# Patient Record
Sex: Female | Born: 1970 | Race: White | Hispanic: No | Marital: Married | Smoking: Current every day smoker
Health system: Southern US, Community
[De-identification: ages and names within clinical notes are randomized; demographics above are authoritative.]

## PROBLEM LIST (undated history)

## (undated) DIAGNOSIS — G44229 Chronic tension-type headache, not intractable: Secondary | ICD-10-CM

## (undated) DIAGNOSIS — F32A Depression, unspecified: Secondary | ICD-10-CM

## (undated) DIAGNOSIS — F329 Major depressive disorder, single episode, unspecified: Secondary | ICD-10-CM

## (undated) HISTORY — PX: CHOLECYSTECTOMY: SHX55

## (undated) HISTORY — PX: TUBAL LIGATION: SHX77

---

## 1997-08-31 ENCOUNTER — Emergency Department (HOSPITAL_COMMUNITY): Admission: EM | Admit: 1997-08-31 | Discharge: 1997-08-31 | Payer: Self-pay | Admitting: Emergency Medicine

## 2005-02-18 ENCOUNTER — Emergency Department: Payer: Self-pay | Admitting: Emergency Medicine

## 2006-08-23 ENCOUNTER — Ambulatory Visit: Payer: Self-pay | Admitting: Emergency Medicine

## 2008-05-23 ENCOUNTER — Emergency Department: Payer: Self-pay | Admitting: Internal Medicine

## 2009-04-14 ENCOUNTER — Emergency Department: Payer: Self-pay | Admitting: Emergency Medicine

## 2009-11-14 ENCOUNTER — Ambulatory Visit: Payer: Self-pay | Admitting: Internal Medicine

## 2011-06-04 ENCOUNTER — Ambulatory Visit: Payer: Self-pay

## 2011-07-23 ENCOUNTER — Ambulatory Visit: Payer: Self-pay

## 2011-08-12 ENCOUNTER — Emergency Department: Payer: Self-pay | Admitting: *Deleted

## 2013-03-04 ENCOUNTER — Ambulatory Visit: Payer: Self-pay

## 2014-06-15 ENCOUNTER — Ambulatory Visit: Payer: Self-pay | Admitting: Emergency Medicine

## 2014-09-10 ENCOUNTER — Ambulatory Visit
Admission: EM | Admit: 2014-09-10 | Discharge: 2014-09-10 | Disposition: A | Discharge status: Home / Self Care | Payer: Self-pay | Attending: Family Medicine | Admitting: Family Medicine

## 2014-09-10 ENCOUNTER — Encounter: Payer: Self-pay | Admitting: Gynecology

## 2014-09-10 DIAGNOSIS — G44209 Tension-type headache, unspecified, not intractable: Secondary | ICD-10-CM

## 2014-09-10 HISTORY — DX: Chronic tension-type headache, not intractable: G44.229

## 2014-09-10 MED ORDER — ONDANSETRON 8 MG PO TBDP: 8.0000 mg | ORAL_TABLET | Freq: Two times a day (BID) | ORAL | Status: DC

## 2014-09-10 MED ORDER — KETOROLAC TROMETHAMINE 10 MG PO TABS: 10.0000 mg | ORAL_TABLET | Freq: Three times a day (TID) | ORAL | Status: DC | PRN

## 2014-09-10 MED ORDER — KETOROLAC TROMETHAMINE 60 MG/2ML IM SOLN
60.0000 mg | Freq: Once | INTRAMUSCULAR | Status: AC
  Administered 2014-09-10: 60 mg via INTRAMUSCULAR

## 2014-09-10 MED ORDER — CYCLOBENZAPRINE HCL 10 MG PO TABS: 10.0000 mg | ORAL_TABLET | Freq: Every day | ORAL | Status: DC

## 2014-09-10 MED ORDER — ONDANSETRON 8 MG PO TBDP
8.0000 mg | ORAL_TABLET | Freq: Once | ORAL | Status: AC
  Administered 2014-09-10: 8 mg via ORAL

## 2014-09-10 NOTE — ED Provider Notes (Addendum)
CSN: [id number]     Arrival date & time 09/10/14  1550 History   First MD Initiated Contact with Patient 09/10/14 1651     Chief Complaint  Patient presents with  . Headache   (Consider location/radiation/quality/duration/timing/severity/associated sxs/prior Treatment) Patient is a 44 y.o. female presenting with headaches. The history is provided by the patient.  Headache Pain location:  Generalized Quality:  Dull (pressurelike band around head) Onset quality:  Sudden Duration:  2 weeks Timing:  Constant Progression:  Waxing and waning (better/less in the mornings ) Chronicity:  Chronic (h/o "mixed headaches") Relieved by: slightly with ibuprofen. Ineffective treatments:  NSAIDs and acetaminophen Associated symptoms: vomiting (3x today)   Associated symptoms: no blurred vision, no congestion, no fever, no hearing loss, no neck stiffness, no numbness, no paresthesias, no photophobia, no sore throat and no swollen glands     Past Medical History  Diagnosis Date  . Chronic tension headache     migrane   Past Surgical History  Procedure Laterality Date  . Cholecystectomy    . Tubal ligation     Family History  Problem Relation Age of Onset  . Hypertension Mother   . Diabetes Mother   . Hyperlipidemia Mother   . Heart failure Father    History  Substance Use Topics  . Smoking status: Current Every Day Smoker -- 0.50 packs/day  . Smokeless tobacco: Not on file  . Alcohol Use: No   OB History    No data available     Review of Systems  Constitutional: Negative for fever.  HENT: Negative for congestion, hearing loss and sore throat.   Eyes: Negative for blurred vision and photophobia.  Gastrointestinal: Positive for vomiting (3x today).  Musculoskeletal: Negative for neck stiffness.  Neurological: Positive for headaches. Negative for numbness and paresthesias.    Allergies  Aspirin  Home Medications   Prior to Admission medications   Medication Sig Start Date  End Date Taking? Authorizing Provider  ibuprofen (ADVIL,MOTRIN) 200 MG tablet Take 200 mg by mouth every 6 (six) hours as needed.   Yes Historical Provider, MD  cyclobenzaprine (FLEXERIL) 10 MG tablet Take 1 tablet (10 mg total) by mouth at bedtime. 09/10/14   Payton Mccallumrlando Jabriel Vanduyne, MD  ketorolac (TORADOL) 10 MG tablet Take 1 tablet (10 mg total) by mouth every 8 (eight) hours as needed. 09/10/14   Payton Mccallumrlando Sarena Jezek, MD  ondansetron (ZOFRAN ODT) 8 MG disintegrating tablet Take 1 tablet (8 mg total) by mouth 2 (two) times daily. 09/10/14   Payton Mccallumrlando Jhaniya Briski, MD   BP 110/80 mmHg  Pulse 74  Temp(Src) 98.1 F (36.7 C) (Oral)  Ht 5\' 1"  (1.549 m)  Wt 145 lb (65.772 kg)  BMI 27.41 kg/m2  SpO2 100%  LMP 09/02/2014 Physical Exam  Constitutional: She is oriented to person, place, and time. She appears well-developed and well-nourished. No distress.  HENT:  Head: Normocephalic.  Right Ear: Tympanic membrane, external ear and ear canal normal.  Left Ear: Tympanic membrane, external ear and ear canal normal.  Nose: Nose normal.  Mouth/Throat: Oropharynx is clear and moist and mucous membranes are normal.  Eyes: Conjunctivae and EOM are normal. Pupils are equal, round, and reactive to light. Right eye exhibits no discharge. Left eye exhibits no discharge. No scleral icterus.  Neck: Normal range of motion. Neck supple. No JVD present. No tracheal deviation present. No thyromegaly present.  Pulmonary/Chest: Effort normal. No stridor. No respiratory distress.  Abdominal: Soft. Bowel sounds are normal. She exhibits no distension and  no mass. There is no tenderness. There is no rebound and no guarding.  Musculoskeletal: She exhibits tenderness. She exhibits no edema.  Lymphadenopathy:    She has no cervical adenopathy.  Neurological: She is alert and oriented to person, place, and time. She has normal reflexes. She displays normal reflexes. No cranial nerve deficit. She exhibits normal muscle tone. Coordination normal.   Skin: Skin is warm and dry. No rash noted. She is not diaphoretic.  Psychiatric: She has a normal mood and affect. Her behavior is normal. Judgment and thought content normal.  Vitals reviewed.   ED Course  Procedures (including critical care time) Labs Review Labs Reviewed - No data to display  Imaging Review No results found.   MDM   1. Tension headache    Discharge Medication List as of 09/10/2014  5:59 PM    START taking these medications   Details  cyclobenzaprine (FLEXERIL) 10 MG tablet Take 1 tablet (10 mg total) by mouth at bedtime., Starting 09/10/2014, Until Discontinued, Normal    ketorolac (TORADOL) 10 MG tablet Take 1 tablet (10 mg total) by mouth every 8 (eight) hours as needed., Starting 09/10/2014, Until Discontinued, Normal    ondansetron (ZOFRAN ODT) 8 MG disintegrating tablet Take 1 tablet (8 mg total) by mouth 2 (two) times daily., Starting 09/10/2014, Until Discontinued, Normal          Payton Mccallum, MD 09/12/14 1910   17:21 Medication Given AH  ketorolac (TORADOL) injection 60 mg - Dose: 60 mg ; Route: Intramuscular ; Site: Other ; Scheduled Time: 1715       Payton Mccallum, MD 09/15/14 1455

## 2014-09-10 NOTE — ED Notes (Signed)
Patient c/o diagnose with tension headache. Pt. Stated had headache x 3 weeks. Pt. Stated [facility] in [place] and unable to get an apt. Until 6/8. Pt. Stated headache cause her to feel nauseated and sometime vomit. Pt. Stated going under a lot of stress.

## 2014-12-02 ENCOUNTER — Encounter: Payer: Self-pay | Admitting: Emergency Medicine

## 2014-12-02 ENCOUNTER — Ambulatory Visit: Payer: No Typology Code available for payment source

## 2014-12-02 ENCOUNTER — Ambulatory Visit
Admission: EM | Admit: 2014-12-02 | Discharge: 2014-12-02 | Disposition: A | Discharge status: Home / Self Care | Payer: No Typology Code available for payment source | Attending: Emergency Medicine | Admitting: Emergency Medicine

## 2014-12-02 DIAGNOSIS — M542 Cervicalgia: Secondary | ICD-10-CM | POA: Diagnosis present

## 2014-12-02 DIAGNOSIS — M62838 Other muscle spasm: Secondary | ICD-10-CM | POA: Diagnosis not present

## 2014-12-02 MED ORDER — METHOCARBAMOL 750 MG PO TABS: 750.0000 mg | ORAL_TABLET | Freq: Three times a day (TID) | ORAL | Status: DC | PRN

## 2014-12-02 MED ORDER — HYDROCODONE-ACETAMINOPHEN 5-325 MG PO TABS: 1.0000 | ORAL_TABLET | Freq: Four times a day (QID) | ORAL | Status: DC | PRN

## 2014-12-02 MED ORDER — KETOROLAC TROMETHAMINE 60 MG/2ML IM SOLN
60.0000 mg | Freq: Once | INTRAMUSCULAR | Status: AC
  Administered 2014-12-02: 60 mg via INTRAMUSCULAR

## 2014-12-02 NOTE — Discharge Instructions (Signed)
Prefer Ibuprofen 800 mg three times a day with meals...supplement with Vicodin if needed.  Do not drive ! Robaxin 750 mg 1 tab at bedtime , may repeat during day if needed- Do not drive !   Back Pain, Adult Back pain is very common. The pain often gets better over time. The cause of back pain is usually not dangerous. Most people can learn to manage their back pain on their own.  HOME CARE   Stay active. Start with short walks on flat ground if you can. Try to walk farther each day.  Do not sit, drive, or stand in one place for more than 30 minutes. Do not stay in bed.  Do not avoid exercise or work. Activity can help your back heal faster.  Be careful when you bend or lift an object. Bend at your knees, keep the object close to you, and do not twist.  Sleep on a firm mattress. Lie on your side, and bend your knees. If you lie on your back, put a pillow under your knees.  Only take medicines as told by your doctor.  Put ice on the injured area.  Put ice in a plastic bag.  Place a towel between your skin and the bag.  Leave the ice on for 15-20 minutes, 03-04 times a day for the first 2 to 3 days. After that, you can switch between ice and heat packs.  Ask your doctor about back exercises or massage.  Avoid feeling anxious or stressed. Find good ways to deal with stress, such as exercise. GET HELP RIGHT AWAY IF:   Your pain does not go away with rest or medicine.  Your pain does not go away in 1 week.  You have new problems.  You do not feel well.  The pain spreads into your legs.  You cannot control when you poop (bowel movement) or pee (urinate).  Your arms or legs feel weak or lose feeling (numbness).  You feel sick to your stomach (nauseous) or throw up (vomit).  You have belly (abdominal) pain.  You feel like you may pass out (faint). MAKE SURE YOU:   Understand these instructions.  Will watch your condition.  Will get help right away if you are not  doing well or get worse. Document Released: 09/19/2007 Document Revised: 06/25/2011 Document Reviewed: 08/04/2013 [facility] Patient Information 2015 [place], [place]. This information is not intended to replace advice given to you by your health care provider. Make sure you discuss any questions you have with your health care provider.  Heat Therapy Heat therapy can help ease sore, stiff, injured, and tight muscles and joints. Heat relaxes your muscles, which may help ease your pain.  RISKS AND COMPLICATIONS If you have any of the following conditions, do not use heat therapy unless your health care provider has approved:  Poor circulation.  Healing wounds or scarred skin in the area being treated.  Diabetes, heart disease, or high blood pressure.  Not being able to feel (numbness) the area being treated.  Unusual swelling of the area being treated.  Active infections.  Blood clots.  Cancer.  Inability to communicate pain. This may include young children and people who have problems with their brain function (dementia).  Pregnancy. Heat therapy should only be used on old, pre-existing, or long-lasting (chronic) injuries. Do not use heat therapy on new injuries unless directed by your health care provider. HOW TO USE HEAT THERAPY There are several different kinds of heat therapy, including:  Moist heat pack.  Warm water bath.  Hot water bottle.  Electric heating pad.  Heated gel pack.  Heated wrap.  Electric heating pad. Use the heat therapy method suggested by your health care provider. Follow your health care provider's instructions on when and how to use heat therapy. GENERAL HEAT THERAPY RECOMMENDATIONS  Do not sleep while using heat therapy. Only use heat therapy while you are awake.  Your skin may turn pink while using heat therapy. Do not use heat therapy if your skin turns red.  Do not use heat therapy if you have new pain.  High heat or long exposure to  heat can cause burns. Be careful when using heat therapy to avoid burning your skin.  Do not use heat therapy on areas of your skin that are already irritated, such as with a rash or sunburn. SEEK MEDICAL CARE IF:  You have blisters, redness, swelling, or numbness.  You have new pain.  Your pain is worse. MAKE SURE YOU:  Understand these instructions.  Will watch your condition.  Will get help right away if you are not doing well or get worse. Document Released: 06/25/2011 Document Revised: 08/17/2013 Document Reviewed: 05/26/2013 [facility] Patient Information 2015 [place], [place]. This information is not intended to replace advice given to you by your health care provider. Make sure you discuss any questions you have with your health care provider.
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
## 2014-12-03 ENCOUNTER — Encounter: Payer: Self-pay | Admitting: Physician Assistant

## 2016-09-25 ENCOUNTER — Encounter: Payer: Self-pay | Admitting: Emergency Medicine

## 2016-09-25 ENCOUNTER — Ambulatory Visit
Admission: EM | Admit: 2016-09-25 | Discharge: 2016-09-25 | Disposition: A | Discharge status: Home / Self Care | Payer: Self-pay | Attending: Family Medicine | Admitting: Family Medicine

## 2016-09-25 DIAGNOSIS — J069 Acute upper respiratory infection, unspecified: Secondary | ICD-10-CM

## 2016-09-25 MED ORDER — ALBUTEROL SULFATE HFA 108 (90 BASE) MCG/ACT IN AERS: 1.0000 | INHALATION_SPRAY | Freq: Four times a day (QID) | RESPIRATORY_TRACT | 0 refills | Status: DC | PRN

## 2016-09-25 MED ORDER — AEROCHAMBER PLUS FLO-VU MEDIUM MISC: 1.0000 | Freq: Once | Status: DC

## 2016-09-25 MED ORDER — HYDROCOD POLST-CPM POLST ER 10-8 MG/5ML PO SUER: 5.0000 mL | Freq: Two times a day (BID) | ORAL | 0 refills | Status: DC

## 2016-09-25 MED ORDER — PREDNISONE 20 MG PO TABS: ORAL_TABLET | ORAL | 0 refills | Status: DC

## 2016-09-25 MED ORDER — BENZONATATE 200 MG PO CAPS: ORAL_CAPSULE | ORAL | 0 refills | Status: DC

## 2016-09-25 NOTE — ED Triage Notes (Signed)
Patient c/o cough, chest congestion and fever that started last Wed.

## 2016-09-25 NOTE — ED Provider Notes (Signed)
CSN: [id number]     Arrival date & time 09/25/16  1553 History   First MD Initiated Contact with Patient 09/25/16 1720     Chief Complaint  Patient presents with  . Cough   (Consider location/radiation/quality/duration/timing/severity/associated sxs/prior Treatment) HPI  This a 46 year old female pack-a-day smoker who presents with cough and chest congestion . This started approximately 6 days ago. She denies that the nighttime cough is the worst. She has not slept well in the last couple of nights. Fever has been consistent until the last couple of days but she's been taking ibuprofen for the fever and has been much less. Initially she had nasal congestion and rhinorrhea this has since stopped. She has had chest pain over the left lower ribs when she coughs.     Past Medical History:  Diagnosis Date  . Chronic tension headache    migrane   Past Surgical History:  Procedure Laterality Date  . CHOLECYSTECTOMY    . TUBAL LIGATION     Family History  Problem Relation Age of Onset  . Hypertension Mother   . Diabetes Mother   . Hyperlipidemia Mother   . Heart failure Father    Social History  Substance Use Topics  . Smoking status: Current Every Day Smoker    Packs/day: 0.50  . Smokeless tobacco: Never Used  . Alcohol use No   OB History    No data available     Review of Systems  Constitutional: Positive for activity change, chills and fever.  HENT: Positive for postnasal drip, rhinorrhea and sore throat.   Respiratory: Positive for cough and shortness of breath.   All other systems reviewed and are negative.   Allergies  Aspirin  Home Medications   Prior to Admission medications   Medication Sig Start Date End Date Taking? Authorizing Provider  albuterol (PROVENTIL HFA;VENTOLIN HFA) 108 (90 Base) MCG/ACT inhaler Inhale 1-2 puffs into the lungs every 6 (six) hours as needed for wheezing or shortness of breath. 09/25/16   Lutricia Feil, PA-C  benzonatate  (TESSALON) 200 MG capsule Take one cap TID PRN cough 09/25/16   Lutricia Feil, PA-C  chlorpheniramine-HYDROcodone [facility]) 10-8 MG/5ML SUER Take 5 mLs by mouth 2 (two) times daily. 09/25/16   Lutricia Feil, PA-C  ibuprofen (ADVIL,MOTRIN) 200 MG tablet Take 200 mg by mouth every 6 (six) hours as needed.    [provider]  predniSONE (DELTASONE) 20 MG tablet Take 2 tablets (40 mg) daily by mouth 09/25/16   Lutricia Feil, PA-C   Meds Ordered and Administered this Visit   Medications  AEROCHAMBER PLUS FLO-VU MEDIUM MISC 1 each (not administered)    BP (!) 151/76 (BP Location: Left Arm)   Pulse 78   Temp 98.5 F (36.9 C) (Oral)   Resp 16   Ht 5\' 1"  (1.549 m)   Wt 135 lb (61.2 kg)   LMP 09/18/2016 (Exact Date)   SpO2 100%   BMI 25.51 kg/m  No data found.   Physical Exam  Constitutional: She is oriented to person, place, and time. She appears well-developed and well-nourished. No distress.  HENT:  Head: Normocephalic.  Eyes: Pupils are equal, round, and reactive to light.  Neck: Normal range of motion.  Pulmonary/Chest: Effort normal and breath sounds normal. No respiratory distress. She has no wheezes. She has no rales.  Musculoskeletal: Normal range of motion.  Neurological: She is alert and oriented to person, place, and time.  Skin: Skin is  warm and dry. She is not diaphoretic.  Psychiatric: She has a normal mood and affect. Her behavior is normal. Judgment and thought content normal.  Nursing note and vitals reviewed.   Urgent Care Course     Procedures (including critical care time)  Labs Review Labs Reviewed - No data to display  Imaging Review No results found.   Visual Acuity Review  Right Eye Distance:   Left Eye Distance:   Bilateral Distance:    Right Eye Near:   Left Eye Near:    Bilateral Near:         MDM   1. Acute upper respiratory infection    Discharge Medication List as of 09/25/2016  5:33 PM     START taking these medications   Details  albuterol (PROVENTIL HFA;VENTOLIN HFA) 108 (90 Base) MCG/ACT inhaler Inhale 1-2 puffs into the lungs every 6 (six) hours as needed for wheezing or shortness of breath., Starting Tue 09/25/2016, Normal    benzonatate (TESSALON) 200 MG capsule Take one cap TID PRN cough, Normal    chlorpheniramine-HYDROcodone (TUSSIONEX PENNKINETIC ER) 10-8 MG/5ML SUER Take 5 mLs by mouth 2 (two) times daily., Starting Tue 09/25/2016, Print    predniSONE (DELTASONE) 20 MG tablet Take 2 tablets (40 mg) daily by mouth, Normal      Plan: 1. Test/x-ray results and diagnosis reviewed with patient 2. rx as per orders; risks, benefits, potential side effects reviewed with patient 3. Recommend supportive treatment with Cessation of smoking. Uses albuterol as necessary for any shortness of breath. Cautioned her regarding the use of the hydrocodone for activities requiring concentration and judgment. She should not drive while using the medication. 4. F/u prn if symptoms worsen or don't improve     Lutricia FeilRoemer, William P, PA-C 09/25/16 1743

## 2016-10-29 ENCOUNTER — Encounter: Payer: Self-pay | Admitting: Emergency Medicine

## 2016-10-29 ENCOUNTER — Emergency Department
Admission: EM | Admit: 2016-10-29 | Discharge: 2016-10-29 | Disposition: A | Discharge status: Home / Self Care | Payer: Self-pay | Attending: Emergency Medicine | Admitting: Emergency Medicine

## 2016-10-29 DIAGNOSIS — M25559 Pain in unspecified hip: Secondary | ICD-10-CM | POA: Insufficient documentation

## 2016-10-29 DIAGNOSIS — M533 Sacrococcygeal disorders, not elsewhere classified: Secondary | ICD-10-CM

## 2016-10-29 DIAGNOSIS — G8929 Other chronic pain: Secondary | ICD-10-CM

## 2016-10-29 DIAGNOSIS — F17210 Nicotine dependence, cigarettes, uncomplicated: Secondary | ICD-10-CM | POA: Insufficient documentation

## 2016-10-29 MED ORDER — DEXAMETHASONE SODIUM PHOSPHATE 10 MG/ML IJ SOLN
10.0000 mg | Freq: Once | INTRAMUSCULAR | Status: AC
  Administered 2016-10-29: 10 mg via INTRAMUSCULAR
  Filled 2016-10-29: qty 1

## 2016-10-29 MED ORDER — LIDOCAINE 5 % EX PTCH: 1.0000 | MEDICATED_PATCH | Freq: Two times a day (BID) | CUTANEOUS | 0 refills | Status: DC

## 2016-10-29 MED ORDER — DIAZEPAM 5 MG PO TABS: 5.0000 mg | ORAL_TABLET | Freq: Every evening | ORAL | 0 refills | Status: DC | PRN

## 2016-10-29 MED ORDER — PREDNISONE 10 MG PO TABS: ORAL_TABLET | ORAL | 0 refills | Status: DC

## 2016-10-29 NOTE — ED Notes (Signed)
See triage note   States she developed lower back pain for about 1 year   States pain has changed in intensity  Ambulates well to treatment area

## 2016-10-29 NOTE — ED Triage Notes (Signed)
Pt with low back pain for over a year.

## 2016-10-29 NOTE — ED Provider Notes (Signed)
[facility] Emergency Department Provider Note  ____________________________________________   None    (approximate)  I have reviewed the triage vital signs and the nursing notes.   HISTORY  Chief Complaint Back Pain    HPI Anita [name] is a 46 y.o. female is here with complaint of low back pain for almost one year. Patient denies any injury. She states that she has continued to work in with the pain. She has taken over-the-counter medication including ibuprofen 4-6 tablets 3 times a day without any relief of her pain. She denies any urinary symptoms. Occasionally she does get pain in her left leg. Patient is continue to ambulate and work every day despite her pain. Today she rates her pain as a 6 out of 10. She has not seen a medical provider for this prior to today. Patient states she is unable to sleep well at night because of pain.   Past Medical History:  Diagnosis Date  . Chronic tension headache    migrane    There are no active problems to display for this patient.   Past Surgical History:  Procedure Laterality Date  . CHOLECYSTECTOMY    . TUBAL LIGATION      Prior to Admission medications   Medication Sig Start Date End Date Taking? Authorizing Provider  albuterol (PROVENTIL HFA;VENTOLIN HFA) 108 (90 Base) MCG/ACT inhaler Inhale 1-2 puffs into the lungs every 6 (six) hours as needed for wheezing or shortness of breath. 09/25/16   Lutricia Feil, PA-C  diazepam (VALIUM) 5 MG tablet Take 1 tablet (5 mg total) by mouth at bedtime as needed for muscle spasms. 10/29/16   Tommi Rumps, PA-C  ibuprofen (ADVIL,MOTRIN) 200 MG tablet Take 200 mg by mouth every 6 (six) hours as needed.    [provider]  lidocaine (LIDODERM) 5 % Place 1 patch onto the skin every 12 (twelve) hours. Remove & Discard patch within 12 hours or as directed by MD 10/29/16 10/29/17  Tommi Rumps, PA-C  predniSONE (DELTASONE) 10 MG tablet Take 6 tablets   today, on day 2 take 5 tablets, day 3 take 4 tablets, day 4 take 3 tablets, day 5 take  2 tablets and 1 tablet the last day 10/29/16   Tommi Rumps, PA-C    Allergies Aspirin  Family History  Problem Relation Age of Onset  . Hypertension Mother   . Diabetes Mother   . Hyperlipidemia Mother   . Heart failure Father     Social History Social History  Substance Use Topics  . Smoking status: Current Every Day Smoker    Packs/day: 0.50  . Smokeless tobacco: Never Used  . Alcohol use No    Review of Systems Constitutional: No fever/chills Cardiovascular: Denies chest pain. Respiratory: Denies shortness of breath. Gastrointestinal: No abdominal pain.  No nausea, no vomiting.   Genitourinary: Negative for dysuria. Musculoskeletal: Positive for back pain. Skin: Negative for rash. Neurological: Negative for headaches, focal weakness or numbness.   ____________________________________________   PHYSICAL EXAM:  VITAL SIGNS: ED Triage Vitals [10/29/16 1547]  Enc Vitals Group     BP      Pulse      Resp      Temp      Temp src      SpO2      Weight 135 lb (61.2 kg)     Height      Head Circumference      Peak Flow  Pain Score 6     Pain Loc      Pain Edu?      Excl. in GC?     Constitutional: Alert and oriented. Well appearing and in no acute distress. Eyes: Conjunctivae are normal. PERRL. EOMI. Head: Atraumatic. Neck: No stridor.   Cardiovascular: Normal rate, regular rhythm. Grossly normal heart sounds.  Good peripheral circulation. Respiratory: Normal respiratory effort.  No retractions. Lungs CTAB. Gastrointestinal: Soft and nontender. No distention.  No CVA tenderness. Musculoskeletal: On examination of the back there is no gross deformity and no tenderness on palpation of the spinous processes. There is moderate tenderness on palpation of the left SI joint area and soft tissues surrounding that. Straight leg raises are negative but uncomfortable with  the left leg. Patient is ambulatory without assistance. Good muscle strength is present bilaterally. Neurologic:  Normal speech and language. No gross focal neurologic deficits are appreciated. Reflexes are 2+ bilaterally. No gait instability. Skin:  Skin is warm, dry and intact. No ecchymosis, erythema or abrasions are noted. Psychiatric: Mood and affect are normal. Speech and behavior are normal.  ____________________________________________   LABS (all labs ordered are listed, but only abnormal results are displayed)  Labs Reviewed - No data to display   PROCEDURES  Procedure(s) performed: None  Procedures  Critical Care performed: No  ____________________________________________   INITIAL IMPRESSION / ASSESSMENT AND PLAN / ED COURSE  Pertinent labs & imaging results that were available during my care of the patient were reviewed by me and considered in my medical decision making (see chart for details).  Patient was encouraged [facility] any continued problems. She is continue using ice and heat to the area. She is started on prednisone for the next 6 days as tapering dose beginning at 60 mg. Diazepam 5 mg only at bedtime as needed for muscle spasms. She was warned that this medication could cause drowsiness increase her risk for injury. She is also given a prescription for Lidoderm patch 1 every 12 hours over the area that is painful.   ___________________________________________   FINAL CLINICAL IMPRESSION(S) / ED DIAGNOSES  Final diagnoses:  Chronic left SI joint pain      NEW MEDICATIONS STARTED DURING THIS VISIT:  Discharge Medication List as of 10/29/2016  5:07 PM    START taking these medications   Details  diazepam (VALIUM) 5 MG tablet Take 1 tablet (5 mg total) by mouth at bedtime as needed for muscle spasms., Starting Mon 10/29/2016, Print    lidocaine (LIDODERM) 5 % Place 1 patch onto the skin every 12  (twelve) hours. Remove & Discard patch within 12 hours or as directed by MD, Starting Mon 10/29/2016, Until Tue 10/29/2017, Print         Note:  This document was prepared using Dragon voice recognition software and may include unintentional dictation errors.    Tommi RumpsSummers, Rhonda L, PA-C 10/29/16 1827    Minna AntisPaduchowski, Kevin, MD 10/30/16 Kristopher [id number]

## 2016-10-29 NOTE — Discharge Instructions (Signed)
[facility] if any continued problems. Continue using ice or heat to the area. Begin taking prednisone as directed for the next 6 days. Valium 5 mg only at bedtime as needed for spasms. Did not take this medication during the day as it could cause drowsiness increase her risk for injury. Lidoderm patch one every 12 hours directly on the area that is painful.

## 2017-08-29 ENCOUNTER — Other Ambulatory Visit: Payer: Self-pay

## 2017-08-29 ENCOUNTER — Ambulatory Visit
Admission: EM | Admit: 2017-08-29 | Discharge: 2017-08-29 | Disposition: A | Discharge status: Home / Self Care | Payer: Self-pay | Attending: Family Medicine | Admitting: Family Medicine

## 2017-08-29 ENCOUNTER — Encounter: Payer: Self-pay | Admitting: Gynecology

## 2017-08-29 DIAGNOSIS — J340 Abscess, furuncle and carbuncle of nose: Secondary | ICD-10-CM

## 2017-08-29 DIAGNOSIS — J3489 Other specified disorders of nose and nasal sinuses: Secondary | ICD-10-CM

## 2017-08-29 HISTORY — DX: Depression, unspecified: F32.A

## 2017-08-29 HISTORY — DX: Major depressive disorder, single episode, unspecified: F32.9

## 2017-08-29 MED ORDER — KETOROLAC TROMETHAMINE 60 MG/2ML IM SOLN
60.0000 mg | Freq: Once | INTRAMUSCULAR | Status: AC
  Administered 2017-08-29: 60 mg via INTRAMUSCULAR

## 2017-08-29 MED ORDER — MUPIROCIN 2 % EX OINT: TOPICAL_OINTMENT | CUTANEOUS | 0 refills | Status: DC

## 2017-08-29 MED ORDER — DOXYCYCLINE HYCLATE 100 MG PO CAPS: 100.0000 mg | ORAL_CAPSULE | Freq: Two times a day (BID) | ORAL | 0 refills | Status: DC

## 2017-08-29 NOTE — Discharge Instructions (Addendum)
Take medication as prescribed. Rest. Drink plenty of fluids.  ° °Follow up with your primary care physician this week as needed. Return to Urgent care for new or worsening concerns.  ° °

## 2017-08-29 NOTE — ED Triage Notes (Signed)
Patient c/o nose pain x 4 days.

## 2017-08-29 NOTE — ED Provider Notes (Signed)
[facility] ____________________________________________  Time seen: Approximately 6:11 PM  I have reviewed the triage vital signs and the nursing notes.   HISTORY  Chief Complaint Nose pain  HPI Anita [name] is a 47 y.o. female presenting for evaluation of right-sided nasal pain present for the last 3 days.  Denies any trauma, injury or known trigger.  Patient reports she is just getting back from a recent beach trip but denies any atypical changes there as well.  Denies insect bite.  States that on Monday she felt a small area against her right nostril that like she was developing a typical temple, but reports the tenderness quickly increased.  Reports over the last 2 days she has had increased tenderness as well as some mild swelling and redness as well as tenderness on the inside of her right nostril to.  Denies any drainage.  Reports able to continue to breathe through each nostril.  Denies history of same in the past.  No history of MRSA or previous skin infections.  Reports otherwise feels well.  Has taken some over-the-counter Tylenol and ibuprofen without much change.  No recent cough, congestion, sinus pain.  Reports otherwise feels well. Denies recent sickness. Denies recent antibiotic use.  Reports up-to-date on tetanus imitation.  [place]:PCP   Past Medical History:  Diagnosis Date  . Chronic tension headache    migrane  . Depression     There are no active problems to display for this patient.   Past Surgical History:  Procedure Laterality Date  . CHOLECYSTECTOMY    . TUBAL LIGATION       No current facility-administered medications for this encounter.   Current Outpatient Medications:  .  ALPRAZolam (XANAX) 0.25 MG tablet, Take by mouth., Disp: , Rfl:  .  ibuprofen (ADVIL,MOTRIN) 200 MG tablet, Take 200 mg by mouth every 6 (six) hours as needed., Disp: , Rfl:  .  venlafaxine XR (EFFEXOR-XR) 75 MG 24 hr capsule, Take by mouth.,  Disp: , Rfl:  .  doxycycline (VIBRAMYCIN) 100 MG capsule, Take 1 capsule (100 mg total) by mouth 2 (two) times daily., Disp: 20 capsule, Rfl: 0 .  mupirocin ointment (BACTROBAN) 2 %, Apply two times a day for 7 days., Disp: 22 g, Rfl: 0  Allergies Aspirin  Family History  Problem Relation Age of Onset  . Hypertension Mother   . Diabetes Mother   . Hyperlipidemia Mother   . Heart failure Father     Social History Social History   Tobacco Use  . Smoking status: Current Every Day Smoker    Packs/day: 0.50  . Smokeless tobacco: Never Used  Substance Use Topics  . Alcohol use: No  . Drug use: No    Review of Systems Constitutional: No fever Eyes: No visual changes. ENT: No sore throat. Cardiovascular: Denies chest pain. Respiratory: Denies shortness of breath. Gastrointestinal: No abdominal pain.   Musculoskeletal: Negative for back pain. Skin: As above.    ____________________________________________   PHYSICAL EXAM:  VITAL SIGNS: ED Triage Vitals  Enc Vitals Group     BP 08/29/17 1724 (!) 142/87     Pulse Rate 08/29/17 1724 87     Resp 08/29/17 1724 16     Temp 08/29/17 1724 98.6 F (37 C)     Temp Source 08/29/17 1724 Oral     SpO2 08/29/17 1724 100 %     Weight 08/29/17 1725 120 lb (54.4 kg)     Height 08/29/17 1725   (1.549 m)     Head Circumference --      Peak Flow --      Pain Score 08/29/17 1725 5     Pain Loc --      Pain Edu? --      Excl. in GC? --     Constitutional: Alert and oriented. Well appearing and in no acute distress. Eyes: Conjunctivae are normal.  Head: Atraumatic. No sinus tenderness to palpation. No swelling. No erythema.  Ears: no erythema, normal TMs bilaterally.   Nose:No nasal congestion   Mouth/Throat: Mucous membranes are moist. No pharyngeal erythema. No tonsillar swelling or exudate.  Neck: No stridor.  No cervical spine tenderness to palpation. Hematological/Lymphatic/Immunilogical: No cervical  lymphadenopathy. Cardiovascular: Normal rate, regular rhythm. Grossly normal heart sounds.  Good peripheral circulation. Respiratory: Normal respiratory effort.  No retractions. No wheezes, rales or rhonchi. Good air movement.  Musculoskeletal: Ambulatory with steady gait. No cervical, thoracic or lumbar tenderness to palpation. Neurologic:  Normal speech and language. No gait instability. Skin:  Skin appears warm, dry. Except: right lateral nasal fold crease mild induration and erythema with tenderness, internal nare with lateral nasal fold mild thickened skin, no fluctuance, no drainage, no further surrounding erythema or tenderness, bilateral nares patent. Psychiatric: Mood and affect are normal. Speech and behavior are normal.  ___________________________________________   LABS (all labs ordered are listed, but only abnormal results are displayed)  Labs Reviewed - No data to display ____________________________________________   PROCEDURES Procedures    INITIAL IMPRESSION / ASSESSMENT AND PLAN / ED COURSE  Pertinent labs & imaging results that were available during my care of the patient were reviewed by me and considered in my medical decision making (see chart for details).  Well-appearing patient.  No acute distress.  Suspect right facial nasal cellulitis.  Will treat patient with oral doxycycline and topical Bactroban.  Toradol IM given in urgent care.  Encouraged keeping clean, closely monitoring and supportive care.Discussed indication, risks and benefits of medications with patient.  Discussed follow up with Primary care physician this week. Discussed follow up and return parameters including no resolution or any worsening concerns. Patient verbalized understanding and agreed to plan.   ____________________________________________   FINAL CLINICAL IMPRESSION(S) / ED DIAGNOSES  Final diagnoses:  Cellulitis of nose     ED Discharge Orders        Ordered     doxycycline (VIBRAMYCIN) 100 MG capsule  2 times daily     08/29/17 1743    mupirocin ointment (BACTROBAN) 2 %     08/29/17 1743       Note: This dictation was prepared with Dragon dictation along with smaller phrase technology. Any transcriptional errors that result from this process are unintentional.         Renford Dills, NP 08/29/17 1851

## 2018-02-06 ENCOUNTER — Encounter: Payer: Self-pay | Admitting: Emergency Medicine

## 2018-02-06 ENCOUNTER — Emergency Department
Admission: EM | Admit: 2018-02-06 | Discharge: 2018-02-06 | Discharge status: Against Medical Advice | Payer: Self-pay | Attending: Student in an Organized Health Care Education/Training Program | Admitting: Student in an Organized Health Care Education/Training Program

## 2018-02-06 ENCOUNTER — Other Ambulatory Visit: Payer: Self-pay

## 2018-02-06 DIAGNOSIS — Z79899 Other long term (current) drug therapy: Secondary | ICD-10-CM | POA: Insufficient documentation

## 2018-02-06 DIAGNOSIS — M5412 Radiculopathy, cervical region: Secondary | ICD-10-CM | POA: Insufficient documentation

## 2018-02-06 DIAGNOSIS — F17210 Nicotine dependence, cigarettes, uncomplicated: Secondary | ICD-10-CM | POA: Insufficient documentation

## 2018-02-06 MED ORDER — IBUPROFEN 600 MG PO TABS
600.0000 mg | ORAL_TABLET | Freq: Once | ORAL | Status: AC
  Administered 2018-02-06: 600 mg via ORAL
  Filled 2018-02-06: qty 1

## 2018-02-06 MED ORDER — LIDOCAINE 5 % EX PTCH
1.0000 | MEDICATED_PATCH | CUTANEOUS | Status: DC
  Administered 2018-02-06: 1 via TRANSDERMAL
  Filled 2018-02-06: qty 1

## 2018-02-06 NOTE — ED Notes (Signed)
See triage note  Presents with pain which radiates from neck into left arm for the past 1 month  Also has been having a tension like h/a   Describes pain to shoulder and arm as numbness

## 2018-02-06 NOTE — ED Provider Notes (Signed)
[facility] Emergency Department Provider Note  ____________________________________________  Time seen: Approximately 7:19 PM  I have reviewed the triage vital signs and the nursing notes.   HISTORY  Chief Complaint Numbness and Neck Pain    HPI Anita [name] is a 47 y.o. female presents emergency department for evaluation of neck pain and left arm numbness and tingling for 1 month.  Patient states that symptoms worsened over the last week.  Pain worsens when she turns her head to the left.  Pain is giving her a headache.  She sees [facility] but states that they would not see her because she owes them money.  No fever.  No IV drug use.  No trauma.  She has been taking ibuprofen for pain.  No vision changes, shortness of breath, chest pain, abdominal pain.   Past Medical History:  Diagnosis Date  . Chronic tension headache    migrane  . Depression     There are no active problems to display for this patient.   Past Surgical History:  Procedure Laterality Date  . CHOLECYSTECTOMY    . TUBAL LIGATION      Prior to Admission medications   Medication Sig Start Date End Date Taking? Authorizing Provider  ALPRAZolam Prudy Feeler) 0.25 MG tablet Take by mouth. 08/13/17   [provider]  doxycycline (VIBRAMYCIN) 100 MG capsule Take 1 capsule (100 mg total) by mouth 2 (two) times daily. 08/29/17   Renford Dills, NP  ibuprofen (ADVIL,MOTRIN) 200 MG tablet Take 200 mg by mouth every 6 (six) hours as needed.    [provider]  mupirocin ointment (BACTROBAN) 2 % Apply two times a day for 7 days. 08/29/17   Renford Dills, NP  venlafaxine XR (EFFEXOR-XR) 75 MG 24 hr capsule Take by mouth. 08/08/17 09/07/17  [provider]    Allergies Aspirin  Family History  Problem Relation Age of Onset  . Hypertension Mother   . Diabetes Mother   . Hyperlipidemia Mother   . Heart failure Father     Social History Social History    Tobacco Use  . Smoking status: Current Every Day Smoker    Packs/day: 0.50  . Smokeless tobacco: Never Used  Substance Use Topics  . Alcohol use: No  . Drug use: No     Review of Systems  Constitutional: No fever/chills Cardiovascular: No chest pain. Respiratory: No SOB. Gastrointestinal: No abdominal pain.  No nausea, no vomiting.  Musculoskeletal: Positive for neck pain.  Skin: Negative for rash, abrasions, lacerations, ecchymosis. Neurological: Negative for headaches. Positive for numbness or tingling   ____________________________________________   PHYSICAL EXAM:  VITAL SIGNS: ED Triage Vitals  Enc Vitals Group     BP 02/06/18 1604 139/82     Pulse Rate 02/06/18 1604 81     Resp 02/06/18 1603 18     Temp 02/06/18 1604 98.3 F (36.8 C)     Temp Source 02/06/18 1603 Oral     SpO2 02/06/18 1604 99 %     Weight 02/06/18 1603 125 lb (56.7 kg)     Height 02/06/18 1603 5\' 1"  (1.549 m)     Head Circumference --      Peak Flow --      Pain Score 02/06/18 1609 5     Pain Loc --      Pain Edu? --      Excl. in GC? --      Constitutional: Alert and oriented. Well appearing and  in no acute distress. Eyes: Conjunctivae are normal. PERRL. EOMI. Head: Atraumatic. ENT:      Ears:      Nose: No congestion/rhinnorhea.      Mouth/Throat: Mucous membranes are moist.  Neck: No stridor. Cervical spine tenderness to palpation. Cardiovascular: Normal rate, regular rhythm.  Good peripheral circulation. Respiratory: Normal respiratory effort without tachypnea or retractions. Lungs CTAB. Good air entry to the bases with no decreased or absent breath sounds. Musculoskeletal: Full range of motion to all extremities. No gross deformities appreciated.  Strength equal in upper extremities bilaterally.  Neurologic:  Normal speech and language. No gross focal neurologic deficits are appreciated.  Skin:  Skin is warm, dry and intact. No rash noted. Psychiatric: Mood and affect are  normal. Speech and behavior are normal. Patient exhibits appropriate insight and judgement.   ____________________________________________   LABS (all labs ordered are listed, but only abnormal results are displayed)  Labs Reviewed - No data to display ____________________________________________  EKG   ____________________________________________  RADIOLOGY   No results found.  ____________________________________________    PROCEDURES  Procedure(s) performed:    Procedures    Medications  lidocaine (LIDODERM) 5 % 1 patch (1 patch Transdermal Patch Applied 02/06/18 1727)  ibuprofen (ADVIL,MOTRIN) tablet 600 mg (600 mg Oral Given 02/06/18 1727)     ____________________________________________   INITIAL IMPRESSION / ASSESSMENT AND PLAN / ED COURSE  Pertinent labs & imaging results that were available during my care of the patient were reviewed by me and considered in my medical decision making (see chart for details).  Review of the [place] CSRS was performed in accordance of the [facility] prior to dispensing any controlled drugs.     Patient presented to emergency department for evaluation of worsening neck pain and left arm numbness for 1 month .  Patient did not have a driver so ibuprofen and Lidoderm patch were given for symptoms.  MRI of cervical spine was ordered.  Patient eloped.      ____________________________________________  FINAL CLINICAL IMPRESSION(S) / ED DIAGNOSES  Final diagnoses:  None      NEW MEDICATIONS STARTED DURING THIS VISIT:  ED Discharge Orders    None          This chart was dictated using voice recognition software/Dragon. Despite best efforts to proofread, errors can occur which can change the meaning. Any change was purely unintentional.    Enid Derry, PA-C 02/06/18 2220    Willy Eddy, MD 02/06/18 2300

## 2018-02-06 NOTE — ED Triage Notes (Signed)
Pt presents to ED via POV with c/o neck pain, states previous dx of herniated disk, and L arm numbness x 1 month.

## 2018-02-06 NOTE — ED Notes (Signed)
Pt not in room to speak with MRI  Provider aware

## 2020-02-16 ENCOUNTER — Ambulatory Visit
Admission: EM | Admit: 2020-02-16 | Discharge: 2020-02-16 | Disposition: A | Discharge status: Home / Self Care | Payer: Self-pay

## 2020-02-16 ENCOUNTER — Other Ambulatory Visit: Payer: Self-pay

## 2020-02-16 ENCOUNTER — Ambulatory Visit (INDEPENDENT_AMBULATORY_CARE_PROVIDER_SITE_OTHER): Payer: Self-pay

## 2020-02-16 ENCOUNTER — Encounter: Payer: Self-pay | Admitting: Emergency Medicine

## 2020-02-16 DIAGNOSIS — M7989 Other specified soft tissue disorders: Secondary | ICD-10-CM

## 2020-02-16 DIAGNOSIS — M79644 Pain in right finger(s): Secondary | ICD-10-CM

## 2020-02-16 DIAGNOSIS — R609 Edema, unspecified: Secondary | ICD-10-CM

## 2020-02-16 DIAGNOSIS — M6530 Trigger finger, unspecified finger: Secondary | ICD-10-CM

## 2020-02-16 MED ORDER — PREDNISONE 10 MG (21) PO TBPK: ORAL_TABLET | Freq: Every day | ORAL | 0 refills | Status: AC

## 2020-02-16 NOTE — ED Provider Notes (Addendum)
Anita [name] (1-Rh) Anita [name]   [id number] 02/16/20 Arrival Time: 0913  XY:[id number] PAIN  SUBJECTIVE: History from: patient. Anita [name] is a 49 y.o. female complains of right middle  pain that began 2 weeks ago. Denies a precipitating event or specific injury. Localizes the pain to the R middle finger at MIP joint  Describes the pain as constant/ intermittent and achy in character. Has tried OTC medications without relief. Symptoms are made worse with activity. Denies similar symptoms in the past. Denies fever, chills, erythema, ecchymosis, effusion, weakness, numbness and tingling, saddle paresthesias, loss of bowel or bladder function.      ROS: As per HPI.  All other pertinent ROS negative.     Past Medical History:  Diagnosis Date  . Chronic tension headache    migrane  . Depression    Past Surgical History:  Procedure Laterality Date  . CHOLECYSTECTOMY    . TUBAL LIGATION     Allergies  Allergen Reactions  . Aspirin Nausea And Vomiting   No current facility-administered medications on file prior to encounter.   Current Outpatient Medications on File Prior to Encounter  Medication Sig Dispense Refill  . escitalopram (LEXAPRO) 20 MG tablet Take 20 mg by mouth daily.    . hydrOXYzine (VISTARIL) 25 MG capsule Take 25 mg by mouth 3 (three) times daily.    Marland Kitchen ibuprofen (ADVIL,MOTRIN) 200 MG tablet Take 200 mg by mouth every 6 (six) hours as needed.    . traZODone (DESYREL) 100 MG tablet Take 200 mg by mouth at bedtime as needed.    . ALPRAZolam (XANAX) 0.25 MG tablet Take by mouth.    . doxycycline (VIBRAMYCIN) 100 MG capsule Take 1 capsule (100 mg total) by mouth 2 (two) times daily. 20 capsule 0  . mupirocin ointment (BACTROBAN) 2 % Apply two times a day for 7 days. 22 g 0  . venlafaxine XR (EFFEXOR-XR) 75 MG 24 hr capsule Take by mouth.     Social History   Socioeconomic History  . Marital status: Married    Spouse name: Not on file  . Number of children: Not on file  .  Years of education: Not on file  . Highest education level: Not on file  Occupational History  . Not on file  Tobacco Use  . Smoking status: Current Every Day Smoker    Packs/day: 0.50  . Smokeless tobacco: Never Used  Substance and Sexual Activity  . Alcohol use: No  . Drug use: No  . Sexual activity: Not on file  Other Topics Concern  . Not on file  Social History Narrative  . Not on file   Social Determinants of Health   Financial Resource Strain:   . Difficulty of Paying Living Expenses: Not on file  Food Insecurity:   . Worried About Programme researcher, broadcasting/film/video in the Last Year: Not on file  . Ran Out of Food in the Last Year: Not on file  Transportation Needs:   . Lack of Transportation (Medical): Not on file  . Lack of Transportation (Non-Medical): Not on file  Physical Activity:   . Days of Exercise per Week: Not on file  . Minutes of Exercise per Session: Not on file  Stress:   . Feeling of Stress : Not on file  Social Connections:   . Frequency of Communication with Friends and Family: Not on file  . Frequency of Social Gatherings with Friends and Family: Not on file  . Attends Religious Services: Not on  file  . Active Member of Clubs or Organizations: Not on file  . Attends Banker Meetings: Not on file  . Marital Status: Not on file  Intimate Partner Violence:   . Fear of Current or Ex-Partner: Not on file  . Emotionally Abused: Not on file  . Physically Abused: Not on file  . Sexually Abused: Not on file   Family History  Problem Relation Age of Onset  . Hypertension Mother   . Diabetes Mother   . Hyperlipidemia Mother   . Heart failure Father     OBJECTIVE:  Vitals:   02/16/20 0944 02/16/20 0946  BP:  122/72  Pulse:  69  Resp:  18  Temp:  98.5 F (36.9 C)  TempSrc:  Oral  SpO2:  99%  Weight: 130 lb (59 kg)   Height: 5\' 1"  (1.549 m)     General appearance: ALERT; in no acute distress.  Head: NCAT Lungs: Normal respiratory  effort CV: pulses 2+ bilaterally. Cap refill < 2 seconds Musculoskeletal:  Inspection: Skin warm, dry, clear and intact without obvious erythema, effusion, or ecchymosis.  Palpation: right middle finger MIP joint tender to palpation ROM: limited ROM active and passive to R middle finger Skin: warm and dry Neurologic: Ambulates without difficulty; Sensation intact about the upper/ lower extremities Psychological: alert and cooperative; normal mood and affect  DIAGNOSTIC STUDIES:  DG Finger Middle Right  Result Date: 02/16/2020 CLINICAL DATA:  Pain and swelling.  Limited range of motion. EXAM: RIGHT MIDDLE FINGER 2+V COMPARISON:  None. FINDINGS: There is no evidence of fracture or dislocation. There is no evidence of arthropathy or other focal bone abnormality. Soft tissues are unremarkable. IMPRESSION: Negative. Electronically Signed   By: 13/05/2019 M.D.   On: 02/16/2020 10:08     ASSESSMENT & PLAN:  1. Trigger finger, acquired   2. Pain of right middle finger   3. Swelling of right middle finger     Meds ordered this encounter  Medications  . predniSONE (STERAPRED UNI-PAK 21 TAB) 10 MG (21) TBPK tablet    Sig: Take by mouth daily for 6 days. Take 6 tablets on day 1, 5 tablets on day 2, 4 tablets on day 3, 3 tablets on day 4, 2 tablets on day 5, 1 tablet on day 6    Dispense:  21 tablet    Refill:  0    Order Specific Question:   Supervising Provider    Answer:   13/05/2019 Merrilee Jansky   Likely tenosynovitis Also suspicious for arthritic component causing pain Prescribed steroid taper Continue conservative management of rest, ice, and gentle stretches Take ibuprofen as needed for pain relief (may cause abdominal discomfort, ulcers, and GI bleeds avoid taking with other NSAIDs) Follow up with ortho if symptoms persist Return or go to the ER if you have any new or worsening symptoms (fever, chills, chest pain, abdominal pain, changes in bowel or bladder habits, pain  radiating into lower legs)   Reviewed expectations re: course of current medical issues. Questions answered. Outlined signs and symptoms indicating need for more acute intervention. Patient verbalized understanding. After Visit Summary given.       [[id number]], NP 02/16/20 1022    13/02/21, NP 02/16/20 1024

## 2020-02-16 NOTE — Discharge Instructions (Addendum)
Xray is negative for fractures or misalignments  There does appear to be some arthritis in the right middle finger  I have sent in a prednisone taper for you to take for 6 days. 6 tablets on day one, 5 tablets on day two, 4 tablets on day three, 3 tablets on day four, 2 tablets on day five, and 1 tablet on day six.  Follow up with orthopedics if symptoms are persisting after course of steroids.  Follow up with this office or with primary care if symptoms are persisting.  Follow up in the ER for high fever, trouble swallowing, trouble breathing, other concerning symptoms.

## 2020-02-16 NOTE — ED Triage Notes (Signed)
Patient c/o a knot in her right middle finger that started 2 weeks ago. She states her finger is locking up on her.

## 2020-08-22 ENCOUNTER — Ambulatory Visit
Admission: EM | Admit: 2020-08-22 | Discharge: 2020-08-22 | Disposition: A | Discharge status: Home / Self Care | Payer: Self-pay | Attending: Physician Assistant | Admitting: Physician Assistant

## 2020-08-22 ENCOUNTER — Other Ambulatory Visit: Payer: Self-pay

## 2020-08-22 ENCOUNTER — Encounter: Payer: Self-pay | Admitting: Emergency Medicine

## 2020-08-22 ENCOUNTER — Ambulatory Visit (INDEPENDENT_AMBULATORY_CARE_PROVIDER_SITE_OTHER): Payer: Self-pay

## 2020-08-22 DIAGNOSIS — W231XXA Caught, crushed, jammed, or pinched between stationary objects, initial encounter: Secondary | ICD-10-CM

## 2020-08-22 DIAGNOSIS — M65331 Trigger finger, right middle finger: Secondary | ICD-10-CM

## 2020-08-22 MED ORDER — PREDNISONE 50 MG PO TABS: 50.0000 mg | ORAL_TABLET | Freq: Every day | ORAL | 0 refills | Status: AC

## 2020-08-22 MED ORDER — TRAMADOL HCL 50 MG PO TABS: 50.0000 mg | ORAL_TABLET | Freq: Three times a day (TID) | ORAL | 0 refills | Status: DC | PRN

## 2020-08-22 NOTE — ED Triage Notes (Signed)
Patient c/o knot in her knuckle on her right middle finger. She states she has been told she has arthritis. Patient states she hit her finger on the door last night and she states she felt a pop in her middle finger.

## 2020-08-22 NOTE — ED Provider Notes (Signed)
[facility]    CSN: [id number] Arrival date & time: 08/22/20  1257      History   Chief Complaint Chief Complaint  Patient presents with  . Finger Injury    HPI Anita [name] is a 50 y.o. female.   Anita [name] presents with complaints of acute on chronic right middle finger pain. Originally noted months ago, was seen and diagnosed with trigger finger. She was provided a course of prednisone which did not help. She does not have insurance therefore she has not been able to see orthopedics. The pain has not improved since she was seen here in November 2021. She has not had the ability to extend her finger since then, from the PIP joint of right hand middle finger, and has had pain since. Last night her finger struck the door, causing it to extend, and has acutely increased her pain. She has noted a "knot" to the PIP joint for months now. There was never any specific triggering injury. No numbness or tingling. Takes aleve daily, took two today, which only somewhat decreases her pain but it doesn't resolve. She is able to extend at the MCP joint, and no pain to DIP joint. She works in [employer], Customer service manager.     ROS per HPI, negative if not otherwise mentioned.      Past Medical History:  Diagnosis Date  . Chronic tension headache    migrane  . Depression     There are no problems to display for this patient.   Past Surgical History:  Procedure Laterality Date  . CHOLECYSTECTOMY    . TUBAL LIGATION      OB History   No obstetric history on file.      Home Medications    Prior to Admission medications   Medication Sig Start Date End Date Taking? Authorizing Provider  escitalopram (LEXAPRO) 20 MG tablet Take 40 mg by mouth daily. 02/11/20  Yes [provider]  hydrOXYzine (VISTARIL) 25 MG capsule Take 25 mg by mouth 3 (three) times daily. 02/11/20  Yes [provider]  ibuprofen (ADVIL,MOTRIN) 200 MG tablet Take 200 mg by  mouth every 6 (six) hours as needed.   Yes [provider]  predniSONE (DELTASONE) 50 MG tablet Take 1 tablet (50 mg total) by mouth daily with breakfast for 5 days. 08/22/20 08/27/20 Yes Japheth Diekman, Barron Alvine, NP  traMADol (ULTRAM) 50 MG tablet Take 1 tablet (50 mg total) by mouth every 8 (eight) hours as needed. 08/22/20  Yes Linus Mako B, NP  traZODone (DESYREL) 100 MG tablet Take 200 mg by mouth at bedtime as needed. 02/11/20  Yes [provider]  ALPRAZolam Prudy Feeler) 0.25 MG tablet Take by mouth. 08/13/17   [provider]  doxycycline (VIBRAMYCIN) 100 MG capsule Take 1 capsule (100 mg total) by mouth 2 (two) times daily. 08/29/17   Renford Dills, NP  mupirocin ointment (BACTROBAN) 2 % Apply two times a day for 7 days. 08/29/17   Renford Dills, NP  venlafaxine XR (EFFEXOR-XR) 75 MG 24 hr capsule Take by mouth. 08/08/17 09/07/17  [provider]    Family History Family History  Problem Relation Age of Onset  . Hypertension Mother   . Diabetes Mother   . Hyperlipidemia Mother   . Heart failure Father     Social History Social History   Tobacco Use  . Smoking status: Current Every Day Smoker    Packs/day: 0.50  . Smokeless tobacco: Never Used  Substance Use Topics  . Alcohol use: No  . Drug use: No     Allergies   Aspirin   Review of Systems Review of Systems   Physical Exam Triage Vital Signs ED Triage Vitals  Enc Vitals Group     BP 08/22/20 1312 118/87     Pulse Rate 08/22/20 1312 79     Resp 08/22/20 1312 18     Temp 08/22/20 1312 98 F (36.7 C)     Temp Source 08/22/20 1312 Oral     SpO2 08/22/20 1312 100 %     Weight 08/22/20 1310 130 lb (59 kg)     Height 08/22/20 1310 5\' 1"  (1.549 m)     Head Circumference --      Peak Flow --      Pain Score 08/22/20 1310 6     Pain Loc --      Pain Edu? --      Excl. in GC? --    No data found.  Updated Vital Signs BP 118/87 (BP Location: Right Arm)   Pulse 79   Temp 98 F (36.7  C) (Oral)   Resp 18   Ht 5\' 1"  (1.549 m)   Wt 130 lb (59 kg)   SpO2 100%   BMI 24.56 kg/m   Visual Acuity Right Eye Distance:   Left Eye Distance:   Bilateral Distance:    Right Eye Near:   Left Eye Near:    Bilateral Near:     Physical Exam Constitutional:      General: She is not in acute distress.    Appearance: She is well-developed.  Cardiovascular:     Rate and Rhythm: Normal rate.  Pulmonary:     Effort: Pulmonary effort is normal.  Musculoskeletal:     Right hand: Tenderness and bony tenderness present. No swelling, deformity or lacerations. Decreased range of motion. Normal sensation. Normal capillary refill. Normal pulse.     Comments: Pain at right hand middle finger PIP joint with palpation; pain at proximal phalanx on palpation as well; pain primarily with extension of PIP joint, with minimal pain with flexion; unable to extend to 180deg; cap refill < 2 seconds ; no pain or limitation to ROM of MCP joint of middle finger; no redness swelling or warmth noted   Skin:    General: Skin is warm and dry.  Neurological:     Mental Status: She is alert and oriented to person, place, and time.      UC Treatments / Results  Labs (all labs ordered are listed, but only abnormal results are displayed) Labs Reviewed - No data to display  EKG   Radiology DG Finger Middle Right  Result Date: 08/22/2020 CLINICAL DATA:  Jammed finger in door last night with swelling to middle finger. EXAM: RIGHT MIDDLE FINGER 2+V COMPARISON:  None. FINDINGS: There is no evidence of fracture or dislocation. There is no evidence of arthropathy or other focal bone abnormality. Soft tissues are unremarkable. IMPRESSION: Negative. Electronically Signed   By: M.D.   On: 08/22/2020 13:51    Procedures Procedures (including critical care time)  Medications Ordered in UC Medications - No data to display  Initial Impression / Assessment and Plan / UC Course  I have reviewed  the triage vital signs and the nursing notes.  Pertinent labs & imaging results that were available during my care of the patient were reviewed by me and considered in my medical decision making (  see chart for details).     Trigger finger at PIP joint of right hand middle finger, acute on chronic pain. Splinting provided today although unable to get to extension of the finger due to pain. Encouraged follow up with ortho as able to maximize long term function of the finger. Patient verbalized understanding and agreeable to plan.    Final Clinical Impressions(s) / UC Diagnoses   Final diagnoses:  Trigger finger, right middle finger     Discharge Instructions     Use of the splint for comfort, try to gradually increase how straight the splint is to try to promote straightening of the finger, as able.  5 days of prednisone.  Tramadol for breakthrough pain. Can cause drowsiness, take separately from your trazodone.  I do recommend following up with orthopedics as able for more definitive treatment of this and help with long term function of your finger.     ED Prescriptions    Medication Sig Dispense Auth. Provider   predniSONE (DELTASONE) 50 MG tablet Take 1 tablet (50 mg total) by mouth daily with breakfast for 5 days. 5 tablet Linus Mako B, NP   traMADol (ULTRAM) 50 MG tablet Take 1 tablet (50 mg total) by mouth every 8 (eight) hours as needed. 10 tablet Georgetta Haber, NP     I have reviewed the PDMP during this encounter.   Georgetta Haber, NP 08/22/20 1435

## 2020-08-22 NOTE — Discharge Instructions (Addendum)
Use of the splint for comfort, try to gradually increase how straight the splint is to try to promote straightening of the finger, as able.  5 days of prednisone.  Tramadol for breakthrough pain. Can cause drowsiness, take separately from your trazodone.  I do recommend following up with orthopedics as able for more definitive treatment of this and help with long term function of your finger.

## 2021-02-16 ENCOUNTER — Ambulatory Visit
Admission: EM | Admit: 2021-02-16 | Discharge: 2021-02-16 | Disposition: A | Discharge status: Home / Self Care | Payer: Medicaid Other | Attending: Emergency Medicine | Admitting: Emergency Medicine

## 2021-02-16 DIAGNOSIS — R051 Acute cough: Secondary | ICD-10-CM | POA: Insufficient documentation

## 2021-02-16 DIAGNOSIS — J111 Influenza due to unidentified influenza virus with other respiratory manifestations: Secondary | ICD-10-CM | POA: Insufficient documentation

## 2021-02-16 DIAGNOSIS — R509 Fever, unspecified: Secondary | ICD-10-CM | POA: Insufficient documentation

## 2021-02-16 LAB — RAPID INFLUENZA A&B ANTIGENS
Influenza A (ARMC): NEGATIVE
Influenza B (ARMC): NEGATIVE

## 2021-02-16 MED ORDER — ACETAMINOPHEN 500 MG PO TABS
1000.0000 mg | ORAL_TABLET | Freq: Once | ORAL | Status: AC
  Administered 2021-02-16: 1000 mg via ORAL

## 2021-02-16 MED ORDER — OSELTAMIVIR PHOSPHATE 75 MG PO CAPS: 75.0000 mg | ORAL_CAPSULE | Freq: Two times a day (BID) | ORAL | 0 refills | Status: AC

## 2021-02-16 MED ORDER — GUAIFENESIN-CODEINE 100-10 MG/5ML PO SOLN: 10.0000 mL | Freq: Four times a day (QID) | ORAL | 0 refills | Status: DC | PRN

## 2021-02-16 NOTE — ED Provider Notes (Signed)
[facility]    CSN: [id number] Arrival date & time: 02/16/21  1910      History   Chief Complaint Chief Complaint  Patient presents with   Fever   Bodyaches    HPI Anita [name] is a 50 y.o. female presenting for onset of fever, fatigue, body aches, chills, cough and nasal congestion yesterday.  She has been taking ibuprofen for her symptoms but the last dose was earlier this morning.  She has taken 2 at home COVID test which were both negative.  Patient denies any shortness of breath or pain in her chest.  Patient has had a couple episodes of posttussive vomiting but she says that is not uncommon for her when she gets sick.  No sick contacts or known exposure to influenza or COVID-19.  Medical history significant for being a current everyday smoker.  HPI  Past Medical History:  Diagnosis Date   Chronic tension headache    migrane   Depression     There are no problems to display for this patient.   Past Surgical History:  Procedure Laterality Date   CHOLECYSTECTOMY     TUBAL LIGATION      OB History   No obstetric history on file.      Home Medications    Prior to Admission medications   Medication Sig Start Date End Date Taking? Authorizing Provider  escitalopram (LEXAPRO) 20 MG tablet Take 40 mg by mouth daily. 02/11/20  Yes [provider]  guaiFENesin-codeine 100-10 MG/5ML syrup Take 10 mLs by mouth every 6 (six) hours as needed for cough. 02/16/21  Yes Eusebio Friendly B, PA-C  ibuprofen (ADVIL,MOTRIN) 200 MG tablet Take 200 mg by mouth every 6 (six) hours as needed.   Yes [provider]  oseltamivir (TAMIFLU) 75 MG capsule Take 1 capsule (75 mg total) by mouth every 12 (twelve) hours for 5 days. 02/16/21 02/21/21 Yes Shirlee Latch, PA-C  ALPRAZolam Prudy Feeler) 0.25 MG tablet Take by mouth. 08/13/17   [provider]  doxycycline (VIBRAMYCIN) 100 MG capsule Take 1 capsule (100 mg total) by mouth 2 (two) times daily. 08/29/17    Renford Dills, NP  hydrOXYzine (VISTARIL) 25 MG capsule Take 25 mg by mouth 3 (three) times daily. 02/11/20   [provider]  mupirocin ointment (BACTROBAN) 2 % Apply two times a day for 7 days. 08/29/17   Renford Dills, NP  traMADol (ULTRAM) 50 MG tablet Take 1 tablet (50 mg total) by mouth every 8 (eight) hours as needed. 08/22/20   Georgetta Haber, NP  traZODone (DESYREL) 100 MG tablet Take 200 mg by mouth at bedtime as needed. 02/11/20   [provider]  venlafaxine XR (EFFEXOR-XR) 75 MG 24 hr capsule Take by mouth. 08/08/17 09/07/17  [provider]    Family History Family History  Problem Relation Age of Onset   Hypertension Mother    Diabetes Mother    Hyperlipidemia Mother    Heart failure Father     Social History Social History   Tobacco Use   Smoking status: Every Day    Packs/day: 0.50    Types: Cigarettes   Smokeless tobacco: Never  Vaping Use   Vaping Use: Every day   Substances: Nicotine, Flavoring  Substance Use Topics   Alcohol use: No   Drug use: Yes    Types: Marijuana     Allergies   Aspirin   Review of Systems Review of Systems  Constitutional:  Positive  for chills, fatigue and fever. Negative for diaphoresis.  HENT:  Positive for congestion and rhinorrhea. Negative for ear pain, sinus pressure, sinus pain and sore throat.   Respiratory:  Positive for cough. Negative for shortness of breath.   Gastrointestinal:  Positive for nausea and vomiting. Negative for abdominal pain and diarrhea.  Musculoskeletal:  Positive for myalgias. Negative for arthralgias.  Skin:  Negative for rash.  Neurological:  Positive for headaches. Negative for weakness.  Hematological:  Negative for adenopathy.    Physical Exam Triage Vital Signs ED Triage Vitals  Enc Vitals Group     BP      Pulse      Resp      Temp      Temp src      SpO2      Weight      Height      Head Circumference      Peak Flow      Pain Score      Pain  Loc      Pain Edu?      Excl. in GC?    No data found.  Updated Vital Signs BP 134/86 (BP Location: Right Arm)   Pulse (!) 106   Temp 99.6 F (37.6 C) (Oral)   Resp 18   Ht 5\' 1"  (1.549 m)   Wt 130 lb (59 kg)   LMP 05/01/2017   SpO2 97%   BMI 24.56 kg/m      Physical Exam Vitals and nursing note reviewed.  Constitutional:      General: She is not in acute distress.    Appearance: Normal appearance. She is ill-appearing. She is not toxic-appearing.  HENT:     Head: Normocephalic and atraumatic.     Nose: Congestion present.     Mouth/Throat:     Mouth: Mucous membranes are moist.     Pharynx: Oropharynx is clear.  Eyes:     General: No scleral icterus.       Right eye: No discharge.        Left eye: No discharge.     Conjunctiva/sclera: Conjunctivae normal.  Cardiovascular:     Rate and Rhythm: Regular rhythm. Tachycardia present.     Heart sounds: Normal heart sounds.  Pulmonary:     Effort: Pulmonary effort is normal. No respiratory distress.     Breath sounds: Normal breath sounds. No wheezing, rhonchi or rales.  Musculoskeletal:     Cervical back: Neck supple.  Skin:    General: Skin is dry.  Neurological:     General: No focal deficit present.     Mental Status: She is alert. Mental status is at baseline.     Motor: No weakness.     Coordination: Coordination normal.     Gait: Gait normal.  Psychiatric:        Mood and Affect: Mood normal.        Behavior: Behavior normal.        Thought Content: Thought content normal.     UC Treatments / Results  Labs (all labs ordered are listed, but only abnormal results are displayed) Labs Reviewed  RAPID INFLUENZA A&B ANTIGENS    EKG   Radiology No results found.  Procedures Procedures (including critical care time)  Medications Ordered in UC Medications  acetaminophen (TYLENOL) tablet 1,000 mg (1,000 mg Oral Given 02/16/21 2040)    Initial Impression / Assessment and Plan / UC Course  I have  reviewed the triage  vital signs and the nursing notes.  Pertinent labs & imaging results that were available during my care of the patient were reviewed by me and considered in my medical decision making (see chart for details).  50 year old female presenting for fever, fatigue, body aches, cough and congestion.  Symptom onset yesterday.  Temp currently 99.6 degrees.  Patient is ill-appearing but nontoxic.  Exam stable for nasal congestion.  Chest clear to auscultation heart regular rate and rhythm.  Rapid flu test obtained.  Negative.  Patient reports that she had COVID-19 3 months ago.  She also says that she has had 2 negative at home COVID test.  Given her recent history of COVID-19, unlikely that she would have COVID again this quickly.  Also, given the increased cases of influenza we are seeing, I suspect that her symptoms are likely due to the flu and her test was a false negative.  We will go ahead and treat her with Tamiflu.  Have also sent Cheratussin.  Advised her to increase rest and fluids and continue taking antipyretics for the fever.  I have given her a work note.  I did review following up if her symptoms are not improving or if they begin to worsen.  Final Clinical Impressions(s) / UC Diagnoses   Final diagnoses:  Influenza-like illness  Fever, unspecified fever cause  Acute cough     Discharge Instructions      -Your flu test was negative but I think this was a false negative.  - I sent cough medication to pharmacy and Tamiflu since I do believe this is probably flu. - Take Tylenol and Motrin as needed for fever control.  Make sure to increase rest and fluids. - If you begin to have chest pain or breathing difficulty or feeling worse you need to call 911 or go to the emergency department. - If you are not breaking the fever in the next few days or start to feel worse you should be seen again.  Your chest is clear when I listen to you so I have very low suspicion for  pneumonia but if you are not improving or your symptoms are worsening you will need to be seen again and we we will need to do further work-up.       ED Prescriptions     Medication Sig Dispense Auth. Provider   oseltamivir (TAMIFLU) 75 MG capsule Take 1 capsule (75 mg total) by mouth every 12 (twelve) hours for 5 days. 10 capsule Eusebio Friendly B, PA-C   guaiFENesin-codeine 100-10 MG/5ML syrup Take 10 mLs by mouth every 6 (six) hours as needed for cough. 120 mL Shirlee Latch, PA-C      PDMP not reviewed this encounter.   Shirlee Latch, PA-C 02/16/21 2103

## 2021-02-16 NOTE — ED Triage Notes (Signed)
Pt reports fever, chills, bodyahces, sore throat, nasal congestion, cough, and fatigue that started yesterday.  Pt taking OTC Ibuprofen   Home COIVD test negative x2 yesterday and today  Pt is smoker

## 2021-02-16 NOTE — Discharge Instructions (Signed)
-  Your flu test was negative but I think this was a false negative.  - I sent cough medication to pharmacy and Tamiflu since I do believe this is probably flu. - Take Tylenol and Motrin as needed for fever control.  Make sure to increase rest and fluids. - If you begin to have chest pain or breathing difficulty or feeling worse you need to call 911 or go to the emergency department. - If you are not breaking the fever in the next few days or start to feel worse you should be seen again.  Your chest is clear when I listen to you so I have very low suspicion for pneumonia but if you are not improving or your symptoms are worsening you will need to be seen again and we we will need to do further work-up.

## 2021-02-17 ENCOUNTER — Emergency Department
Admission: EM | Admit: 2021-02-17 | Discharge: 2021-02-18 | Discharge status: Against Medical Advice | Payer: Medicaid Other

## 2021-03-07 IMAGING — CR DG FINGER MIDDLE 2+V*R*
3 series · 3 of 3 positions shown · non-contrast
Comparison: None.

CLINICAL DATA: Pain and swelling.  Limited range of motion.

EXAM:
RIGHT MIDDLE FINGER 2+V

[finger ap]
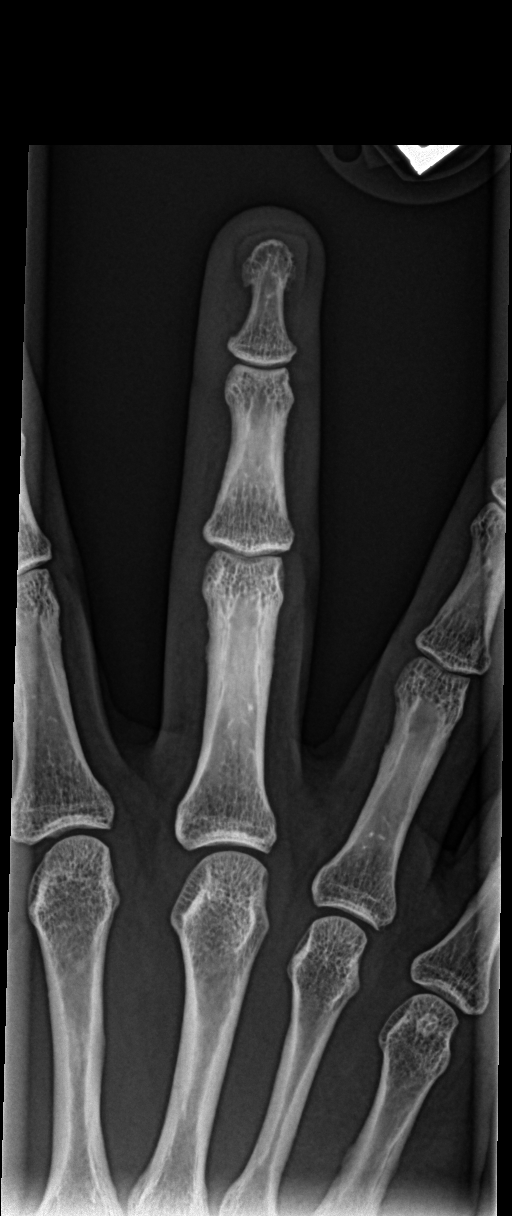

[finger obl]
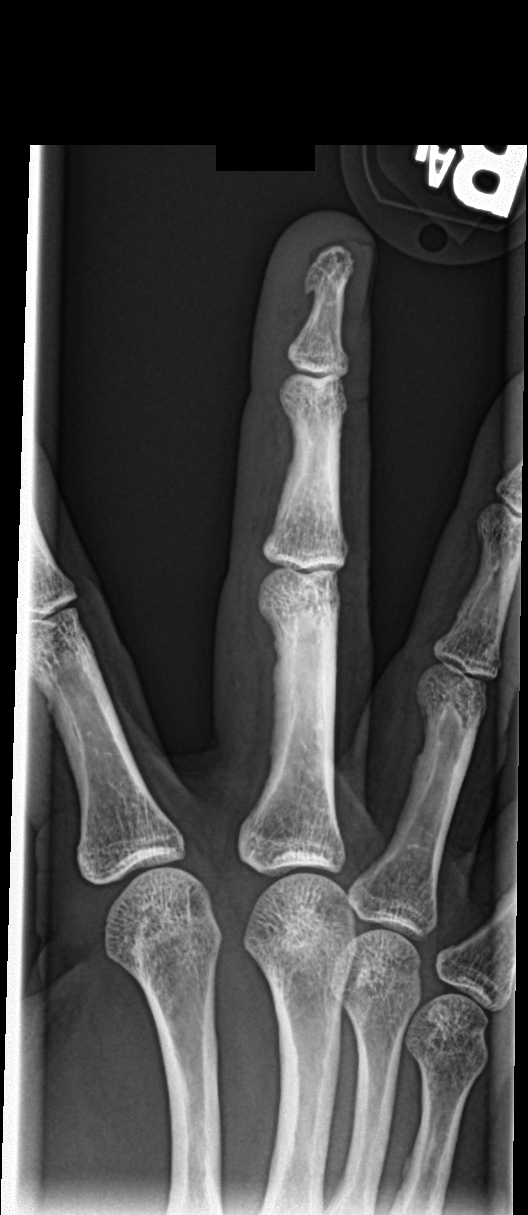

[finger lat]
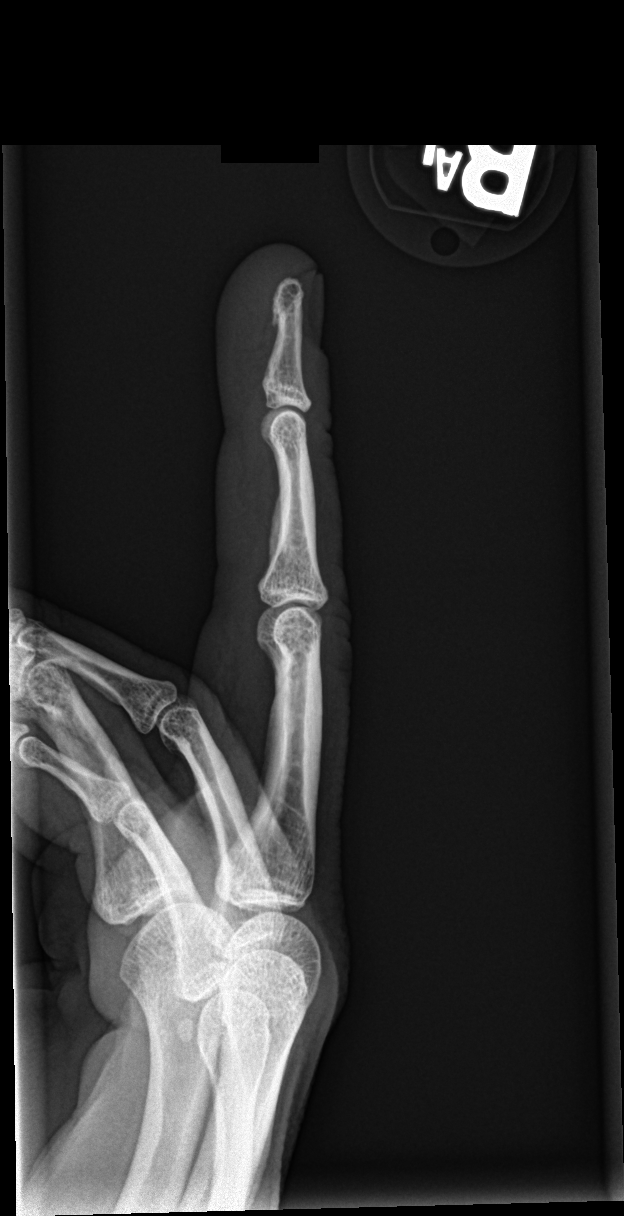

[3 of 3 positions shown; findings below may reference images not displayed]

FINDINGS: There is no evidence of fracture or dislocation. There is no
evidence of arthropathy or other focal bone abnormality. Soft
tissues are unremarkable.
IMPRESSION: Negative.

## 2021-07-13 ENCOUNTER — Ambulatory Visit
Admission: EM | Admit: 2021-07-13 | Discharge: 2021-07-13 | Disposition: A | Discharge status: Home / Self Care | Payer: Self-pay

## 2021-07-13 ENCOUNTER — Other Ambulatory Visit: Payer: Self-pay

## 2021-07-13 DIAGNOSIS — M79642 Pain in left hand: Secondary | ICD-10-CM

## 2021-07-13 DIAGNOSIS — Z765 Malingerer [conscious simulation]: Secondary | ICD-10-CM

## 2021-07-13 DIAGNOSIS — G8929 Other chronic pain: Secondary | ICD-10-CM

## 2021-07-13 NOTE — ED Triage Notes (Signed)
Pt is here with left hand pain that started in January 2023 she has seen an online dr for it. Pt stated it has become more tender and painful in the last week, pt has taken OTC meds to relieve discomfort. Online dr stated her left hand pain was due to triggered dumb, wanted to be seen for further evaluation.  ?

## 2021-07-13 NOTE — ED Provider Notes (Addendum)
[facility]    CSN: [id number] Arrival date & time: 07/13/21  6010      History   Chief Complaint Chief Complaint  Patient presents with   Hand Pain    HPI Anita [name] is a 51 y.o. female presenting with chronic hand pain.  Describes about 1.5 years of right hand pain, and now 4 months of left hand pain.  This is related to her job which involves repetitive motions and movement.  States the left hand has become more painful, particularly over the thenar pad of the left hand.  There is some numbness and tingling in the palmar aspect of the hand as well.  States that she takes ibuprofen 800 mg 3-4 times daily which only takes the edge off.  She is tearful stating that she needs "something for this fucking pain", but insists that she does not want narcotic medication.  This is her third visit to [place] for similar issue, and she is also followed by a TeleDoc.  She is already doing stretching, bracing, Tylenol/ibuprofen, ice, rest is much as possible.  She has been advised to follow-up with hand specialist multiple times, this has been an issue as she does not have insurance.  She understands that she may permanently lose function due to poor follow-up.  HPI  Past Medical History:  Diagnosis Date   Chronic tension headache    migrane   Depression     There are no problems to display for this patient.   Past Surgical History:  Procedure Laterality Date   CHOLECYSTECTOMY     TUBAL LIGATION      OB History   No obstetric history on file.      Home Medications    Prior to Admission medications   Medication Sig Start Date End Date Taking? Authorizing Provider  ALPRAZolam Prudy Feeler) 0.25 MG tablet Take by mouth. 08/13/17   [provider]  busPIRone (BUSPAR) 5 MG tablet Take by mouth. 05/13/21   [provider]  doxycycline (VIBRAMYCIN) 100 MG capsule Take 1 capsule (100 mg total) by mouth 2 (two) times daily. 08/29/17   Renford Dills, NP  escitalopram  (LEXAPRO) 10 MG tablet Take 10 mg by mouth daily. 05/13/21   [provider]  escitalopram (LEXAPRO) 20 MG tablet Take 40 mg by mouth daily. 02/11/20   [provider]  guaiFENesin-codeine 100-10 MG/5ML syrup Take 10 mLs by mouth every 6 (six) hours as needed for cough. 02/16/21   Shirlee Latch, PA-C  hydrOXYzine (ATARAX) 25 MG tablet Take 25-50 mg by mouth every 6 (six) hours as needed. 04/16/21   [provider]  hydrOXYzine (VISTARIL) 25 MG capsule Take 25 mg by mouth 3 (three) times daily. 02/11/20   [provider]  ibuprofen (ADVIL,MOTRIN) 200 MG tablet Take 200 mg by mouth every 6 (six) hours as needed.    [provider]  mupirocin ointment (BACTROBAN) 2 % Apply two times a day for 7 days. 08/29/17   Renford Dills, NP  traMADol (ULTRAM) 50 MG tablet Take 1 tablet (50 mg total) by mouth every 8 (eight) hours as needed. 08/22/20   Georgetta Haber, NP  traZODone (DESYREL) 100 MG tablet Take 200 mg by mouth at bedtime as needed. 02/11/20   [provider]  venlafaxine XR (EFFEXOR-XR) 75 MG 24 hr capsule Take by mouth. 08/08/17 09/07/17  [provider]    Family History Family History  Problem Relation Age of Onset   Hypertension  Mother    Diabetes Mother    Hyperlipidemia Mother    Heart failure Father     Social History Social History   Tobacco Use   Smoking status: Every Day    Packs/day: 0.50    Types: Cigarettes   Smokeless tobacco: Never  Vaping Use   Vaping Use: Every day   Substances: Nicotine, Flavoring  Substance Use Topics   Alcohol use: No   Drug use: Yes    Types: Marijuana     Allergies   Aspirin   Review of Systems Review of Systems  Musculoskeletal:        Chronic hand pain   All other systems reviewed and are negative.   Physical Exam Triage Vital Signs ED Triage Vitals  Enc Vitals Group     BP 07/13/21 1044 134/78     Pulse Rate 07/13/21 1044 93     Resp 07/13/21 1044 18     Temp  07/13/21 1044 98.3 F (36.8 C)     Temp Source 07/13/21 1044 Oral     SpO2 07/13/21 1044 100 %     Weight --      Height --      Head Circumference --      Peak Flow --      Pain Score 07/13/21 1041 0     Pain Loc --      Pain Edu? --      Excl. in GC? --    No data found.  Updated Vital Signs BP 134/78 (BP Location: Left Arm)   Pulse 93   Temp 98.3 F (36.8 C) (Oral)   Resp 18   LMP 05/01/2017   SpO2 100%   Visual Acuity Right Eye Distance:   Left Eye Distance:   Bilateral Distance:    Right Eye Near:   Left Eye Near:    Bilateral Near:     Physical Exam Vitals reviewed.  Constitutional:      General: She is not in acute distress.    Appearance: Normal appearance. She is not ill-appearing.  HENT:     Head: Normocephalic and atraumatic.  Pulmonary:     Effort: Pulmonary effort is normal.  Musculoskeletal:     Comments: L hand - TTP thenar pad and IP joint. ROM intact but with stiffness and pain. Pain out of proportion to exam. Grip strength 3/5. No snuffbox tenderness. Radial pulse 2+, cap refill <2 seconds.  Neurological:     General: No focal deficit present.     Mental Status: She is alert and oriented to person, place, and time.  Psychiatric:        Mood and Affect: Mood normal. Affect is tearful.        Behavior: Behavior normal.        Thought Content: Thought content normal.        Judgment: Judgment normal.     UC Treatments / Results  Labs (all labs ordered are listed, but only abnormal results are displayed) Labs Reviewed - No data to display  EKG   Radiology No results found.  Procedures Procedures (including critical care time)  Medications Ordered in UC Medications - No data to display  Initial Impression / Assessment and Plan / UC Course  I have reviewed the triage vital signs and the nursing notes.  Pertinent labs & imaging results that were available during my care of the patient were reviewed by me and considered in my  medical decision making (see chart  for details).     This patient is a 51 y.o. 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 Neurovascularly intact, no acute trauma. This is her 3rd visit to [place] in the last year for the same issue; last visit with [place] was 08/22/20, but she is also following regularly with a teledoc per pt. She has been advised multiple time to follow-up with Hand/ortho, which she has not yet done. At her last visit with [place], we emphasized that her condition would continue to progress and she may experience permanent deficits without this follow-up. She has previously failed treatment with prednisone, muscle relaxers, tylenol/ibuprofen, ice, stretching. Her current regimen is ibuprofen 800mg  tid, ice, and daily stretching, which is poorly controlling the symptoms.   Pt presented today with an acute flare of the chronic hand pain, which is related to her job. Physical exam indicated diffuse pain, but no acute bony injury or deformity. We discussed treatment options, and patient declined prednisone, muscle relaxer, ibuprofen, as these had not worked for her in the past. I offered to give her a work note for 3 days, which is the longest note I can provide; my hope was that this would give her time to finally see a hand specialist, or at least an orthopedist; and, as her job was contributing to the problem, perhaps she could also follow-up with occupational health discuss FMLA or disability.   However, upon hearing that she had exhausted interventions available in the urgent care setting, the patient became upset, stating she needs something for pain. I reiterated that she needs to see a hand specialist, and I clarified that I was not going to prescribe a narcotic for the pain at today's visit. I knew that we had prescribed Tramadol at her prior visit (08/2020), and I assumed that this is what she was requesting. Patient did state one time that she did not want a  narcotic; however, upon hearing that I could not provide her with additional pain control, she began yelling, "I NEED SOMETHING FOR THIS F@CKING  PAIN!"  At this point, I left the room, and asked RN Denorah to discharge the patient. Per RN Denorah - As patient was discharged, she expressed dissatisfaction with the visit, stating I treated her as drug seeking and "did nothing." She asked RN if she could take a drug test; this request was denied as we do not have capability to perform drug test at our urgent cares. She expressed to RN that I apparently refused to order an Xray or prescribe prednisone, which is not accurate - I would have been happy to do these interventions. In particular, I would have been amenable to x-ray, though it would not change management. The patient then immediately left without waiting to discuss this further, and did not reach out to myself or [place] to discuss her dissatisfaction with the visit.    Final Clinical Impressions(s) / UC Diagnoses   Final diagnoses:  Chronic pain of left hand     Discharge Instructions      -You can take Tylenol up to 1000 mg 3 times daily, and ibuprofen up to 600 mg 3-4 times daily with food.  You can take these together, or alternate every 3-4 hours. -Continue the stretching, rest, ice, tylenol/ibuprofen, brace at night -Please try to follow-up with the hand specialist! Information below.  -ED if pain becomes unbearable     ED Prescriptions   None    PDMP not reviewed this encounter.  Rhys Martini, PA-C 07/13/21 1130    Rhys Martini, PA-C 07/13/21 1141    Rhys Martini, PA-C 08/27/21 [phone]

## 2021-07-13 NOTE — Discharge Instructions (Addendum)
-  You can take Tylenol up to 1000 mg 3 times daily, and ibuprofen up to 600 mg 3-4 times daily with food.  You can take these together, or alternate every 3-4 hours. ?-Continue the stretching, rest, ice, tylenol/ibuprofen, brace at night ?-Please try to follow-up with the hand specialist! Information below.  ?-ED if pain becomes unbearable ? ?

## 2022-05-04 ENCOUNTER — Telehealth: Payer: Self-pay

## 2022-05-04 NOTE — Telephone Encounter (Signed)
Mychart msg sent. AS, CMA 

## 2022-07-03 DIAGNOSIS — H5213 Myopia, bilateral: Secondary | ICD-10-CM | POA: Diagnosis not present

## 2022-10-10 ENCOUNTER — Ambulatory Visit: Payer: Self-pay

## 2022-10-10 NOTE — Telephone Encounter (Signed)
Patient has been scheduled for new patient appt with Dr. Roxan Hockey on 11/28/2022 & stated she has been going through possible Menopause with her symptoms of some pain, night sweats, spotting from period, vaginal dryness, irritability, not sleeping, hot flashes, and she stated this happens every so often and that she has been going on since she was 45. Per patient, she wants a call back to discuss these symptoms before her new patient appt.  Patient seeking clinical advice, please advise.     Chief Complaint: Pt. Having hot flashes, insomnia, irritability, spotting at times. Does not have OB/GYN. Symptoms: Above Frequency: May Pertinent Negatives: Patient denies  Disposition: [] ED /[] Urgent Care (no appt availability in office) / [] Appointment(In office/virtual)/ []  [facility]/ [x] Home Care/ [] Refused Recommended Disposition /[] [facility] [facility]/ []  Follow-up with PCP Additional Notes: Pt. Put on wait list for new pt. Appointment.  Reason for Disposition  Normal menopause symptoms (e.g., hot flashes, irregular periods, vaginal dryness)  Answer Assessment - Initial Assessment Questions 1. MAIN SYMPTOM: "What is your main symptom?" "How bad is it?"  (e.g., difficulty concentrating, hot flashes, fatigue, irregular periods, vaginal dryness)     Spotting, cramping 2. ONSET: "When did the  begin?" (e.g., hours, days or weeks ago)     May 3. MENOPAUSE: "When was your last menstrual period?"     52 years of age 67. MEDICINES: "Are you taking any hormone prescription or over-the-counter medicines?" (e.g., estrogen; estrogen/progesterone, herbal supplements, medicines for mood, new medicines, Niacin)     No 5. OTHER SYMPTOMS: "Do you have any other symptoms?" (e.g., insomnia, mood swings)     Hot flashes, insomnia, irritability  6. CALLER'S COPING: "How are you doing?" (e.g., anxiety, depression, emotional state with menopause symptoms)     Frustrated 7. TREATMENTS AND RESPONSE: "What  have you done so far to try to make this better?"     Nothing 8. PREGNANCY: "Is there any chance you are pregnant?" "When was your last menstrual period?"     No  Protocols used: Menopause Symptoms and Questions-A-AH

## 2022-11-27 NOTE — Progress Notes (Unsigned)
New patient visit   Patient: Anita [name]   DOB: [birth year]   52 y.o. Female  MRN: [id number] Visit Date: 11/28/2022  Today's healthcare provider: Ronnald Ramp, MD   No chief complaint on file.  Subjective    Anita [name] is a 52 y.o. female who presents today as a new patient to establish care.      Discussed the use of AI scribe software for clinical note transcription with the patient, who gave verbal consent to proceed.  History of Present Illness              Past Medical History:  Diagnosis Date   Chronic tension headache    migrane   Depression    Past Surgical History:  Procedure Laterality Date   CHOLECYSTECTOMY     TUBAL LIGATION     Family Status  Relation Name Status   Mother  Alive   Father  Deceased  No partnership data on file   Family History  Problem Relation Age of Onset   Hypertension Mother    Diabetes Mother    Hyperlipidemia Mother    Heart failure Father    Social History   Socioeconomic History   Marital status: Married    Spouse name: Not on file   Number of children: Not on file   Years of education: Not on file   Highest education level: Associate degree: academic program  Occupational History   Not on file  Tobacco Use   Smoking status: Every Day    Current packs/day: 0.50    Types: Cigarettes   Smokeless tobacco: Never  Vaping Use   Vaping status: Every Day   Substances: Nicotine, Flavoring  Substance and Sexual Activity   Alcohol use: No   Drug use: Yes    Types: Marijuana   Sexual activity: Yes  Other Topics Concern   Not on file  Social History Narrative   Not on file   Social Determinants of Health   Financial Resource Strain: High Risk (11/24/2022)   Overall Financial Resource Strain (CARDIA)    Difficulty of Paying Living Expenses: Very hard  Food Insecurity: Food Insecurity Present (11/24/2022)   Hunger Vital Sign    Worried About Running Out of Food in the Last Year: Often  true    Ran Out of Food in the Last Year: Often true  Transportation Needs: No Transportation Needs (11/24/2022)   PRAPARE - Administrator, Civil Service (Medical): No    Lack of Transportation (Non-Medical): No  Physical Activity: Unknown (11/24/2022)   Exercise Vital Sign    Days of Exercise per Week: 0 days    Minutes of Exercise per Session: Not on file  Stress: Stress Concern Present (11/24/2022)   [employer] of Occupational Health - Occupational Stress Questionnaire    Feeling of Stress : To some extent  Social Connections: Socially Isolated (11/24/2022)   Social Connection and Isolation Panel [NHANES]    Frequency of Communication with Friends and Family: Never    Frequency of Social Gatherings with Friends and Family: Never    Attends Religious Services: Never    Database administrator or Organizations: No    Attends Engineer, structural: Not on file    Marital Status: Married    Outpatient Medications Prior to Visit  Medication Sig   ALPRAZolam (XANAX) 0.25 MG tablet Take by mouth.   busPIRone (BUSPAR) 5 MG tablet Take by mouth.   doxycycline (VIBRAMYCIN)  100 MG capsule Take 1 capsule (100 mg total) by mouth 2 (two) times daily.   escitalopram (LEXAPRO) 10 MG tablet Take 10 mg by mouth daily.   escitalopram (LEXAPRO) 20 MG tablet Take 40 mg by mouth daily.   guaiFENesin-codeine 100-10 MG/5ML syrup Take 10 mLs by mouth every 6 (six) hours as needed for cough.   hydrOXYzine (ATARAX) 25 MG tablet Take 25-50 mg by mouth every 6 (six) hours as needed.   hydrOXYzine (VISTARIL) 25 MG capsule Take 25 mg by mouth 3 (three) times daily.   ibuprofen (ADVIL,MOTRIN) 200 MG tablet Take 200 mg by mouth every 6 (six) hours as needed.   mupirocin ointment (BACTROBAN) 2 % Apply two times a day for 7 days.   traMADol (ULTRAM) 50 MG tablet Take 1 tablet (50 mg total) by mouth every 8 (eight) hours as needed.   traZODone (DESYREL) 100 MG tablet Take 200 mg by mouth at  bedtime as needed.   venlafaxine XR (EFFEXOR-XR) 75 MG 24 hr capsule Take by mouth.   No facility-administered medications prior to visit.   Allergies  Allergen Reactions   Aspirin Nausea And Vomiting     There is no immunization history on file for this patient.  Health Maintenance  Topic Date Due   HIV Screening  Never done   Hepatitis C Screening  Never done   DTaP/Tdap/Td (1 - Tdap) Never done   PAP SMEAR-Modifier  Never done   Colonoscopy  Never done   MAMMOGRAM  Never done   Zoster Vaccines- Shingrix (1 of 2) Never done   COVID-19 Vaccine (1 - 2023-24 season) Never done   INFLUENZA VACCINE  11/15/2022   HPV VACCINES  Aged Out    Patient Care Team: [place], [facility] as PCP - General  Review of Systems  {Insert previous labs (optional):23779} {See past labs  Heme  Chem  Endocrine  Serology  Results Review (optional):1}   Objective    LMP 05/01/2017  {Insert last BP/Wt (optional):23777}{See vitals history (optional):1}    Depression Screen     No data to display         No results found for any visits on 11/28/22.   Physical Exam ***    Assessment & Plan      Problem List Items Addressed This Visit   None   Assessment and Plan               No follow-ups on file.      Ronnald Ramp, MD  [facility] [phone] (phone) [phone] (fax)  [facility]

## 2022-11-28 ENCOUNTER — Ambulatory Visit: Payer: Medicaid Other | Admitting: Family Medicine

## 2022-11-28 ENCOUNTER — Encounter: Payer: Self-pay | Admitting: Family Medicine

## 2022-11-28 VITALS — BP 122/81 | HR 70 | Ht 61.0 in | Wt 123.3 lb

## 2022-11-28 DIAGNOSIS — N950 Postmenopausal bleeding: Secondary | ICD-10-CM | POA: Diagnosis not present

## 2022-11-28 DIAGNOSIS — R634 Abnormal weight loss: Secondary | ICD-10-CM | POA: Diagnosis not present

## 2022-11-28 DIAGNOSIS — D229 Melanocytic nevi, unspecified: Secondary | ICD-10-CM | POA: Diagnosis not present

## 2022-11-28 DIAGNOSIS — R3589 Other polyuria: Secondary | ICD-10-CM

## 2022-11-28 DIAGNOSIS — Z7689 Persons encountering health services in other specified circumstances: Secondary | ICD-10-CM | POA: Insufficient documentation

## 2022-11-28 DIAGNOSIS — N951 Menopausal and female climacteric states: Secondary | ICD-10-CM | POA: Diagnosis not present

## 2022-11-28 DIAGNOSIS — Z720 Tobacco use: Secondary | ICD-10-CM | POA: Diagnosis not present

## 2022-11-28 NOTE — Assessment & Plan Note (Signed)
Reports a long-standing mole that has recently increased in size and become itchy. -Referred to dermatology for evaluation and potential biopsy.

## 2022-11-28 NOTE — Assessment & Plan Note (Signed)
Chronic, heavy smoker for 30 years. Discussed the risks of continued smoking and the benefits of cessation. -Encouraged smoking cessation. -Consider annual low-dose chest CT for lung cancer screening due to high pack-year history when patient reaches age 52.

## 2022-11-28 NOTE — Assessment & Plan Note (Signed)
Welcomed patient to [facility]  Reviewed patient's medical history, medications, surgical and social history Discussed roles and expectations for primary care physician-patient relationship Recommended patient schedule annual preventative examinations   

## 2022-11-28 NOTE — Assessment & Plan Note (Signed)
Reports loss of approximately 25 pounds over 7 months without intentional dieting or changes in appetite. -Order complete blood count, thyroid studies, and hemoglobin A1c to evaluate for potential causes. -Order complete metabolic panel to evaluate liver function and other metabolic parameters.

## 2022-11-28 NOTE — Patient Instructions (Signed)
VISIT SUMMARY:  During our visit, we discussed your concerns about menopausal symptoms, a mole, and unintentional weight loss. We also talked about your long history of smoking. I have recommended several tests and referrals to specialists to further evaluate these issues. I also encouraged you to quit smoking due to the associated health risks.  YOUR PLAN:  -TOBACCO USE: You've been a heavy smoker for 30 years. This can lead to serious health problems. I strongly recommend that you quit smoking. When you turn 55, we should consider a yearly chest CT scan to check for lung cancer.  -MENOPAUSAL SYMPTOMS: You're experiencing symptoms like hot flashes, night sweats, and mood changes. We're going to check your hormone levels with a blood test. Depending on the results, we might consider a non-hormonal medication called Veozah.  -POSTMENOPAUSAL BLEEDING: You've noticed some spotting, which can be a concern after menopause. We're going to do a pelvic ultrasound to check for any abnormalities. If we find anything unusual, I'll refer you to a gynecologist.  -UNINTENTIONAL WEIGHT LOSS: You've lost a significant amount of weight without trying. We're going to run some tests, including a complete blood count, thyroid studies, and a hemoglobin A1c test, to try to find out why.  -SKIN LESION: You have a mole that's grown and become itchy. I'm referring you to a dermatologist who can evaluate it and possibly do a biopsy.  INSTRUCTIONS:  Please schedule a follow-up appointment in September 2024 so we can review your lab results and discuss next steps. If any of your lab results require immediate attention, we will call you.

## 2022-11-28 NOTE — Assessment & Plan Note (Signed)
Reports hot flashes, night sweats, and mood changes. Previous trial of Lexapro for menopausal symptoms resulted in excessive sedation. -Chronic, worsening QOL due to symptoms  -Order blood work to evaluate hormone levels. -Discussed potential use of non-hormonal medication, Veozah, pending lab results and gynecological evaluation for post menopausal bleeding

## 2022-11-28 NOTE — Assessment & Plan Note (Signed)
Reports occasional spotting, particularly during times of stress. No recent gynecological evaluation. -stable VS, ongoing issue intermently  -Order pelvic ultrasound to evaluate for endometrial thickening or other structural abnormalities. -Refer to gynecology if abnormal findings on ultrasound.

## 2022-11-29 LAB — CMP14+EGFR
ALT: 7 IU/L (ref 0–32)
AST: 12 IU/L (ref 0–40)
Albumin: 4.5 g/dL (ref 3.8–4.9)
Alkaline Phosphatase: 71 IU/L (ref 44–121)
BUN/Creatinine Ratio: 13 (ref 9–23)
BUN: 9 mg/dL (ref 6–24)
Bilirubin Total: 0.4 mg/dL (ref 0.0–1.2)
CO2: 24 mmol/L (ref 20–29)
Calcium: 9.6 mg/dL (ref 8.7–10.2)
Chloride: 101 mmol/L (ref 96–106)
Creatinine, Ser: 0.7 mg/dL (ref 0.57–1.00)
Globulin, Total: 2.7 g/dL (ref 1.5–4.5)
Glucose: 111 mg/dL — ABNORMAL HIGH (ref 70–99)
Potassium: 4 mmol/L (ref 3.5–5.2)
Sodium: 139 mmol/L (ref 134–144)
Total Protein: 7.2 g/dL (ref 6.0–8.5)
eGFR: 105 mL/min/{1.73_m2} (ref >59)

## 2022-11-29 LAB — CBC
Hematocrit: 41.2 % (ref 34.0–46.6)
Hemoglobin: 14.4 g/dL (ref 11.1–15.9)
MCH: 31.2 pg (ref 26.6–33.0)
MCHC: 35 g/dL (ref 31.5–35.7)
MCV: 89 fL (ref 79–97)
Platelets: 198 10*3/uL (ref 150–450)
RBC: 4.62 x10E6/uL (ref 3.77–5.28)
RDW: 11.8 % (ref 11.7–15.4)
WBC: 6.4 10*3/uL (ref 3.4–10.8)

## 2022-11-29 LAB — TSH+T4F+T3FREE
Free T4: 1.36 ng/dL (ref 0.82–1.77)
T3, Free: 3.2 pg/mL (ref 2.0–4.4)
TSH: 0.932 u[IU]/mL (ref 0.450–4.500)

## 2022-11-29 LAB — HEMOGLOBIN A1C
Est. average glucose Bld gHb Est-mCnc: 123 mg/dL
Hgb A1c MFr Bld: 5.9 % — ABNORMAL HIGH (ref 4.8–5.6)

## 2022-12-07 ENCOUNTER — Ambulatory Visit
Admission: RE | Admit: 2022-12-07 | Discharge: 2022-12-07 | Disposition: A | Discharge status: Home / Self Care | Payer: Medicaid Other | Source: Ambulatory Visit | Attending: Family Medicine | Admitting: Family Medicine

## 2022-12-07 DIAGNOSIS — N950 Postmenopausal bleeding: Secondary | ICD-10-CM | POA: Insufficient documentation

## 2022-12-07 DIAGNOSIS — N939 Abnormal uterine and vaginal bleeding, unspecified: Secondary | ICD-10-CM | POA: Diagnosis not present

## 2022-12-07 DIAGNOSIS — N951 Menopausal and female climacteric states: Secondary | ICD-10-CM | POA: Insufficient documentation

## 2022-12-26 ENCOUNTER — Encounter: Payer: Self-pay | Admitting: Family Medicine

## 2022-12-26 ENCOUNTER — Ambulatory Visit: Payer: Medicaid Other | Admitting: Family Medicine

## 2022-12-26 VITALS — BP 149/86 | HR 78 | Ht 61.0 in | Wt 124.3 lb

## 2022-12-26 DIAGNOSIS — R634 Abnormal weight loss: Secondary | ICD-10-CM

## 2022-12-26 DIAGNOSIS — R030 Elevated blood-pressure reading, without diagnosis of hypertension: Secondary | ICD-10-CM | POA: Insufficient documentation

## 2022-12-26 DIAGNOSIS — F419 Anxiety disorder, unspecified: Secondary | ICD-10-CM | POA: Insufficient documentation

## 2022-12-26 DIAGNOSIS — N950 Postmenopausal bleeding: Secondary | ICD-10-CM | POA: Diagnosis not present

## 2022-12-26 DIAGNOSIS — N951 Menopausal and female climacteric states: Secondary | ICD-10-CM | POA: Diagnosis not present

## 2022-12-26 DIAGNOSIS — Z720 Tobacco use: Secondary | ICD-10-CM

## 2022-12-26 MED ORDER — CITALOPRAM HYDROBROMIDE 20 MG PO TABS: 20.0000 mg | ORAL_TABLET | Freq: Every day | ORAL | 1 refills | Status: DC

## 2022-12-26 MED ORDER — VEOZAH 45 MG PO TABS: 45.0000 mg | ORAL_TABLET | Freq: Every day | ORAL | Status: DC

## 2022-12-26 NOTE — Assessment & Plan Note (Signed)
High morning anxiety, history of beneficial response to citalopram. -Resume citalopram 20mg  daily, with a plan to titrate up as tolerated. -Monitor for improvement in mood and anxiety symptoms.

## 2022-12-26 NOTE — Assessment & Plan Note (Signed)
Persistent hot flashes and night sweats, impacting sleep and quality of life. No recent vaginal bleeding. Recent ultrasound showed no abnormalities. -Start Veozah 45mg  daily for vasomotor symptoms. -Consider non-pharmacological measures to manage symptoms (e.g., stress management).

## 2022-12-26 NOTE — Progress Notes (Signed)
Established patient visit   Patient: Anita [name]   DOB: 1971-02-24   52 y.o. Female  MRN: [id number] Visit Date: 12/26/2022  Today's healthcare provider: Ronnald Ramp, MD   Chief Complaint  Patient presents with   Medical Management of Chronic Issues    Follow up for Vaginal bleeding has been going on for 2 years, onset when stressed, or major life stressors, has night sweats, irritable, and hot flashes, weight loss has been consistent for now, took a fall last night and injured left thumb, would like to have evaluated, prior treatment ice baths   Subjective     HPI     Medical Management of Chronic Issues    Additional comments: Follow up for Vaginal bleeding has been going on for 2 years, onset when stressed, or major life stressors, has night sweats, irritable, and hot flashes, weight loss has been consistent for now, took a fall last night and injured left thumb, would like to have evaluated, prior treatment ice baths      Last edited by Rolly Salter, CMA on 12/26/2022  9:02 AM.       Discussed the use of AI scribe software for clinical note transcription with the patient, who gave verbal consent to proceed.   Anita [name] presents for follow up regarding postmenopausal vaginal bleeding and unintentional weight loss for which she had normal CMP, prediabetes range A1c and normal thyroid studies  She also underwent pelvic US that did not show abnormal findings   History of Present Illness   The patient, known to have a history of menopause, presented with concerns about persistent hot flashes and night sweats. She reported experiencing these symptoms daily, with no recollection of a day without them. The patient also noted significant sweating, to the point of feeling self-conscious about potential body odor. The patient's husband observed a correlation between the patient's stress levels and the severity of her hot flashes and night sweats.  The  patient also reported significant irritability, which she was considering managing with an antidepressant. She had previously been on citalopram for stress and anxiety, which she found beneficial. However, she had to discontinue the medication due to financial constraints.  The patient also reported a history of mixed headaches, which had been severe in the past two weeks. She was unsure if these headaches were related to menopause or stress. The patient also reported high levels of morning anxiety, which she managed with marijuana use.  The patient had recently undergone an ultrasound due to concerns about vaginal bleeding. Fortunately, the ultrasound did not reveal any abnormalities. She reported no further episodes of bleeding since the last consultation.  The patient's weight was stable, but she reported a slight increase of one pound. She also reported a high blood pressure reading during the consultation.         12/26/2022    9:05 AM  GAD 7 : Generalized Anxiety Score  Nervous, Anxious, on Edge 3  Control/stop worrying 1  Worry too much - different things 1  Trouble relaxing 1  Restless 2  Easily annoyed or irritable 3  Afraid - awful might happen 1  Total GAD 7 Score 12        Past Medical History:  Diagnosis Date   Chronic tension headache    migrane   Depression     Medications: Outpatient Medications Prior to Visit  Medication Sig   ibuprofen (ADVIL,MOTRIN) 200 MG tablet Take 200 mg by  mouth every 6 (six) hours as needed.   No facility-administered medications prior to visit.    Review of Systems  Last CBC Lab Results  Component Value Date   WBC 6.4 11/28/2022   HGB 14.4 11/28/2022   HCT 41.2 11/28/2022   MCV 89 11/28/2022   MCH 31.2 11/28/2022   RDW 11.8 11/28/2022   PLT 198 11/28/2022   Last metabolic panel Lab Results  Component Value Date   GLUCOSE 111 (H) 11/28/2022   NA 139 11/28/2022   K 4.0 11/28/2022   CL 101 11/28/2022   CO2 24  11/28/2022   BUN 9 11/28/2022   CREATININE 0.70 11/28/2022   EGFR 105 11/28/2022   CALCIUM 9.6 11/28/2022   PROT 7.2 11/28/2022   ALBUMIN 4.5 11/28/2022   LABGLOB 2.7 11/28/2022   BILITOT 0.4 11/28/2022   ALKPHOS 71 11/28/2022   AST 12 11/28/2022   ALT 7 11/28/2022   Last hemoglobin A1c Lab Results  Component Value Date   HGBA1C 5.9 (H) 11/28/2022   Last thyroid functions Lab Results  Component Value Date   TSH 0.932 11/28/2022        Objective    BP (!) 149/86 (BP Location: Left Arm, Patient Position: Sitting, Cuff Size: Large)   Pulse 78   Ht 5\' 1"  (1.549 m)   Wt 124 lb 4.8 oz (56.4 kg)   LMP 05/01/2017   SpO2 100%   BMI 23.49 kg/m  BP Readings from Last 3 Encounters:  12/26/22 (!) 149/86  11/28/22 122/81  07/13/21 134/78   Wt Readings from Last 3 Encounters:  12/26/22 124 lb 4.8 oz (56.4 kg)  11/28/22 123 lb 4.8 oz (55.9 kg)  02/16/21 130 lb (59 kg)       Physical Exam Vitals reviewed.  Constitutional:      General: She is not in acute distress.    Appearance: Normal appearance. She is not ill-appearing, toxic-appearing or diaphoretic.  Eyes:     Conjunctiva/sclera: Conjunctivae normal.  Cardiovascular:     Rate and Rhythm: Normal rate and regular rhythm.     Pulses: Normal pulses.     Heart sounds: Normal heart sounds. No murmur heard.    No friction rub. No gallop.  Pulmonary:     Effort: Pulmonary effort is normal. No respiratory distress.     Breath sounds: Normal breath sounds. No stridor. No wheezing, rhonchi or rales.  Abdominal:     General: Bowel sounds are normal. There is no distension.     Palpations: Abdomen is soft.     Tenderness: There is no abdominal tenderness.  Musculoskeletal:     Right lower leg: No edema.     Left lower leg: No edema.  Skin:    Findings: No erythema or rash.  Neurological:     Mental Status: She is alert and oriented to person, place, and time.  Psychiatric:        Attention and Perception: Attention  normal.        Mood and Affect: Mood is anxious. Mood is not elated. Affect is not labile, flat or tearful.        Speech: Speech normal. Speech is not rapid and pressured, delayed or tangential.        Behavior: Behavior normal. Behavior is cooperative.       No results found for any visits on 12/26/22.  Assessment & Plan     Problem List Items Addressed This Visit     Anxiety  High morning anxiety, history of beneficial response to citalopram. -Resume citalopram 20mg  daily, with a plan to titrate up as tolerated. -Monitor for improvement in mood and anxiety symptoms.      Relevant Medications   citalopram (CELEXA) 20 MG tablet   Elevated blood pressure reading    Elevated blood pressure noted during visit. -Advise patient to monitor blood pressure at home, aiming for readings under 130/80. -Plan to initiate antihypertensive therapy if elevated readings persist. -Schedule follow-up visit in three weeks to reassess blood pressure and response to new medications.            Postmenopausal bleeding    Resolved  Negative pelvic ultrasound       Tobacco use    Smoking cessation instruction/counseling given:  counseled patient on the dangers of tobacco use, advised patient to stop smoking, and reviewed strategies to maximize success       Unintentional weight loss    Resolved Patient has gained 1 pound since last visit  No further weight loss       Vasomotor symptoms due to menopause - Primary    Persistent hot flashes and night sweats, impacting sleep and quality of life. No recent vaginal bleeding. Recent ultrasound showed no abnormalities. -Start Veozah 45mg  daily for vasomotor symptoms. -Consider non-pharmacological measures to manage symptoms (e.g., stress management).            Return in about 3 weeks (around 01/16/2023) for Anxiety.         Ronnald Ramp, MD  [facility] [phone] (phone) [phone]  (fax)  [facility]

## 2022-12-26 NOTE — Assessment & Plan Note (Signed)
Resolved Patient has gained 1 pound since last visit  No further weight loss

## 2022-12-26 NOTE — Assessment & Plan Note (Signed)
Resolved  Negative pelvic ultrasound

## 2022-12-26 NOTE — Assessment & Plan Note (Signed)
Smoking cessation instruction/counseling given:  counseled patient on the dangers of tobacco use, advised patient to stop smoking, and reviewed strategies to maximize success 

## 2022-12-26 NOTE — Patient Instructions (Signed)
VISIT SUMMARY:  During your visit, we discussed your persistent hot flashes and night sweats, which are common symptoms of menopause. We also talked about your significant irritability and your history of mixed headaches. You mentioned that you have been managing your morning anxiety with marijuana. We also reviewed your recent ultrasound results, which showed no abnormalities. Your weight has been stable, but you reported a slight increase of one pound. We also noted a high blood pressure reading during your consultation.  YOUR PLAN:  -MENOPAUSAL SYMPTOMS: Menopause is a natural biological process that marks the end of menstrual cycles. It's diagnosed after you've gone 12 months without a menstrual period. We will start you on Veozah 45mg  daily to help manage your hot flashes and night sweats. We also recommend non-pharmacological measures such as stress management to help manage your symptoms.  -ANXIETY AND MOOD INSTABILITY: Anxiety is a normal and often healthy emotion. However, when a person regularly feels disproportionate levels of anxiety, it might become a medical disorder. We will resume your citalopram 20mg  daily, which you previously found beneficial. We will monitor your mood and anxiety symptoms for improvement.  -HYPERTENSION: Hypertension, or high blood pressure, is a condition where the force of the blood against the artery walls is too high. We advise you to monitor your blood pressure at home, aiming for readings under 130/80. If your readings remain high, we may need to start you on antihypertensive therapy.  INSTRUCTIONS:  Please monitor your blood pressure at home and aim for readings under 130/80. We will schedule a follow-up visit in three weeks to reassess your blood pressure and response to new medications. Please also start taking Veozah 45mg  daily for your menopausal symptoms and resume your citalopram 20mg  daily for anxiety and mood instability.

## 2022-12-26 NOTE — Assessment & Plan Note (Signed)
Elevated blood pressure noted during visit. -Advise patient to monitor blood pressure at home, aiming for readings under 130/80. -Plan to initiate antihypertensive therapy if elevated readings persist. -Schedule follow-up visit in three weeks to reassess blood pressure and response to new medications.

## 2023-01-08 ENCOUNTER — Telehealth: Payer: Self-pay

## 2023-01-08 NOTE — Telephone Encounter (Signed)
Medicaid Managed Care   Unsuccessful Outreach Note  01/08/2023 Name: KMARI KASPRZAK MRN: [id number] DOB: 12-25-1970  Referred by: Ronnald Ramp, MD Reason for referral : No chief complaint on file.   An unsuccessful telephone outreach was attempted today. The patient was referred to the case management team for assistance with care management and care coordination.   Follow Up Plan: If patient returns call to provider office, please advise to call Embedded Care Management Care Guide Nicholes Rough* at [phone]*  Nicholes Rough, CMA Care Guide [employer]

## 2023-01-15 NOTE — Progress Notes (Deleted)
      Established patient visit   Patient: Anita [name]   DOB: Sep 10, 1970   52 y.o. Female  MRN: [id number] Visit Date: 01/16/2023  Today's healthcare provider: Ronnald Ramp, MD   No chief complaint on file.  Subjective       Discussed the use of AI scribe software for clinical note transcription with the patient, who gave verbal consent to proceed.  History of Present Illness             Past Medical History:  Diagnosis Date   Chronic tension headache    migrane   Depression     Medications: Outpatient Medications Prior to Visit  Medication Sig   citalopram (CELEXA) 20 MG tablet Take 1 tablet (20 mg total) by mouth daily.   Fezolinetant (VEOZAH) 45 MG TABS Take 1 tablet (45 mg total) by mouth daily.   ibuprofen (ADVIL,MOTRIN) 200 MG tablet Take 200 mg by mouth every 6 (six) hours as needed.   No facility-administered medications prior to visit.    Review of Systems  {Insert previous labs (optional):23779} {See past labs  Heme  Chem  Endocrine  Serology  Results Review (optional):1}   Objective    LMP 05/01/2017  BP Readings from Last 3 Encounters:  12/26/22 (!) 149/86  11/28/22 122/81  07/13/21 134/78   Wt Readings from Last 3 Encounters:  12/26/22 124 lb 4.8 oz (56.4 kg)  11/28/22 123 lb 4.8 oz (55.9 kg)  02/16/21 130 lb (59 kg)    {See vitals history (optional):1}   Physical Exam  ***  No results found for any visits on 01/16/23.  Assessment & Plan     Problem List Items Addressed This Visit   None   Assessment and Plan              No follow-ups on file.         Ronnald Ramp, MD  [facility] [phone] (phone) [phone] (fax)  [facility]

## 2023-01-16 ENCOUNTER — Ambulatory Visit: Payer: Medicaid Other | Admitting: Family Medicine

## 2023-01-16 DIAGNOSIS — F419 Anxiety disorder, unspecified: Secondary | ICD-10-CM

## 2023-02-12 ENCOUNTER — Ambulatory Visit: Payer: Medicaid Other | Admitting: Family Medicine

## 2023-02-12 ENCOUNTER — Encounter: Payer: Self-pay | Admitting: Family Medicine

## 2023-02-12 VITALS — BP 104/84 | HR 76 | Ht 61.0 in | Wt 123.0 lb

## 2023-02-12 DIAGNOSIS — Z23 Encounter for immunization: Secondary | ICD-10-CM

## 2023-02-12 DIAGNOSIS — Z720 Tobacco use: Secondary | ICD-10-CM

## 2023-02-12 DIAGNOSIS — F419 Anxiety disorder, unspecified: Secondary | ICD-10-CM | POA: Diagnosis not present

## 2023-02-12 DIAGNOSIS — R030 Elevated blood-pressure reading, without diagnosis of hypertension: Secondary | ICD-10-CM

## 2023-02-12 DIAGNOSIS — N951 Menopausal and female climacteric states: Secondary | ICD-10-CM | POA: Diagnosis not present

## 2023-02-12 MED ORDER — CITALOPRAM HYDROBROMIDE 40 MG PO TABS: 40.0000 mg | ORAL_TABLET | Freq: Every day | ORAL | 3 refills | Status: DC

## 2023-02-12 NOTE — Assessment & Plan Note (Signed)
Symptoms improved with Celexa 20mg , but recent increase in hot flashes and irritability. Discontinued Viazah due to nausea. Chronic,improved -Increase Celexa to 40mg  daily. -Discontinue Viazah.

## 2023-02-12 NOTE — Assessment & Plan Note (Signed)
Resolved  Normal range BP in office today

## 2023-02-12 NOTE — Assessment & Plan Note (Signed)
Improved with Celexa 20mg . Recent stressors related to son's drug addiction. Chronic,improved -Increase Celexa to 40mg  daily.

## 2023-02-12 NOTE — Assessment & Plan Note (Signed)
Smoking Cessation encouraged  Discussed in the past, but deferred due to other current stressors. -Plan to address in future visits.

## 2023-02-12 NOTE — Progress Notes (Signed)
Established patient visit   Patient: Anita [name]   DOB: [birth year]   52 y.o. Female  MRN: [id number] Visit Date: 02/12/2023  Today's healthcare provider: Ronnald Ramp, MD   Chief Complaint  Patient presents with   Follow-up    Pt stated--taking the citalopram and d/c Fezolinetant-causing nausea   Subjective     HPI     Follow-up    Additional comments: Pt stated--taking the citalopram and d/c Fezolinetant-causing nausea      Last edited by Shelly Bombard, CMA on 02/12/2023  3:13 PM.       Discussed the use of AI scribe software for clinical note transcription with the patient, who gave verbal consent to proceed.  History of Present Illness   The patient, with a history of menopause and vasomotor symptoms, reports that the [facility] medication was not agreeable, causing nausea. She discontinued the medication after two days due to the severity of the side effects. However, she notes that other medications, including Celexa, have helped manage her menopausal symptoms. She has previously been on Celexa and decided to start it first, noticing an improvement in symptoms after about a week.  Despite the overall improvement, the patient mentions a slight increase in hot flashes and irritability in the past couple of days, suggesting a possible need to increase the dosage of Celexa. However, she also acknowledges that these symptoms could be related to recent stressors, including dealing with a son who is struggling with drug addiction.  The patient also discusses her smoking habit, indicating a willingness to address this issue once other health concerns are managed. She expresses satisfaction with the effects of Celexa, noting that it has helped settle her mind and improve her mood. She reports feeling happy on the day of the consultation.         12/26/2022    9:05 AM  GAD 7 : Generalized Anxiety Score  Nervous, Anxious, on Edge 3  Control/stop worrying 1  Worry  too much - different things 1  Trouble relaxing 1  Restless 2  Easily annoyed or irritable 3  Afraid - awful might happen 1  Total GAD 7 Score 12     Past Medical History:  Diagnosis Date   Chronic tension headache    migrane   Depression     Medications: Outpatient Medications Prior to Visit  Medication Sig   ibuprofen (ADVIL,MOTRIN) 200 MG tablet Take 200 mg by mouth every 6 (six) hours as needed.   [DISCONTINUED] citalopram (CELEXA) 20 MG tablet Take 1 tablet (20 mg total) by mouth daily.   [DISCONTINUED] Fezolinetant (VEOZAH) 45 MG TABS Take 1 tablet (45 mg total) by mouth daily. (Patient not taking: Reported on 02/12/2023)   No facility-administered medications prior to visit.    Review of Systems      Objective    BP 104/84 (BP Location: Left Arm, Patient Position: Sitting, Cuff Size: Normal)   Pulse 76   Ht 5\' 1"  (1.549 m)   Wt 123 lb (55.8 kg)   LMP 05/01/2017   SpO2 96%   BMI 23.24 kg/m  BP Readings from Last 3 Encounters:  02/12/23 104/84  12/26/22 (!) 149/86  11/28/22 122/81   Wt Readings from Last 3 Encounters:  02/12/23 123 lb (55.8 kg)  12/26/22 124 lb 4.8 oz (56.4 kg)  11/28/22 123 lb 4.8 oz (55.9 kg)        Wt Readings from Last 3 Encounters:  02/12/23 123 lb (55.8  kg)  12/26/22 124 lb 4.8 oz (56.4 kg)  11/28/22 123 lb 4.8 oz (55.9 kg)   BP Readings from Last 3 Encounters:  02/12/23 104/84  12/26/22 (!) 149/86  11/28/22 122/81      Chemistry      Component Value Date/Time   NA 139 11/28/2022 1411   K 4.0 11/28/2022 1411   CL 101 11/28/2022 1411   CO2 24 11/28/2022 1411   BUN 9 11/28/2022 1411   CREATININE 0.70 11/28/2022 1411      Component Value Date/Time   CALCIUM 9.6 11/28/2022 1411   ALKPHOS 71 11/28/2022 1411   AST 12 11/28/2022 1411   ALT 7 11/28/2022 1411   BILITOT 0.4 11/28/2022 1411      Lab Results  Component Value Date   HGBA1C 5.9 (H) 11/28/2022   No results found for: "CHOL", "HDL", "LDLCALC",  "LDLDIRECT", "TRIG", "CHOLHDL"  Physical Exam  General: Alert, no acute distress Cardio: Normal S1 and S2, RRR, no r/m/g Pulm: CTAB, normal work of breathing   No results found for any visits on 02/12/23.  Assessment & Plan     Problem List Items Addressed This Visit     Anxiety - Primary    Improved with Celexa 20mg . Recent stressors related to son's drug addiction. Chronic,improved -Increase Celexa to 40mg  daily.      Relevant Medications   citalopram (CELEXA) 40 MG tablet   Elevated blood pressure reading    Resolved  Normal range BP in office today       Tobacco use    Smoking Cessation encouraged  Discussed in the past, but deferred due to other current stressors. -Plan to address in future visits.      Vasomotor symptoms due to menopause    Symptoms improved with Celexa 20mg , but recent increase in hot flashes and irritability. Discontinued Viazah due to nausea. Chronic,improved -Increase Celexa to 40mg  daily. -Discontinue Viazah.      Relevant Medications   citalopram (CELEXA) 40 MG tablet   Other Visit Diagnoses     Encounter for immunization       Relevant Orders   Flu vaccine trivalent PF, 6mos and older(Flulaval,Afluria,Fluarix,Fluzone) (Completed)                     Return in about 12 weeks (around 05/07/2023) for CPE.         Ronnald Ramp, MD  [facility] [phone] (phone) [phone] (fax)  [facility]

## 2023-05-23 NOTE — Progress Notes (Signed)
 Complete physical exam   Patient: Anita [name]   DOB: Dec 26, 1970   53 y.o. Female  MRN: [id number] Visit Date: 05/24/2023  Today's healthcare provider: Rockie Agent, MD   Chief Complaint  Patient presents with   Annual Exam    Concerns about stress and anxiety causing daily headaches   Subjective    Anita [name] is a 53 y.o. female who presents today for a complete physical exam.   She reports consuming a general diet.    Walks her boxer every evening for 30 minutes      She does have additional problems to discuss today.   Discussed the use of AI scribe software for clinical note transcription with the patient, who gave verbal consent to proceed.  History of Present Illness   Anita [name] is a 53 year old female who presents with concerns of anxiety and stress causing frequent headaches.  She experiences frequent headaches, which she attributes to anxiety and stress. She has a history of mixed headaches, including stress headaches leading to migraines, which have now become chronic. She has a headache every day for about three weeks, primarily starting in the temples and moving to the back of her head by the end of the day. The pain is described as tension-like, requiring her to support her head with her hands or a rolled-up pillow to sleep. Sleep alleviates the headache, but ibuprofen  only partially reduces the pain, allowing her to function. She takes 800 mg of ibuprofen  three times a day. No nausea or throbbing headache sensations are present.  She attributes her stress to personal and work-related issues, including her son's addiction and recent COVID-19 infection among her husband's work crew. Her son is currently three days clean after a recent jail stint. 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 She is currently on Celexa  40 mg for anxiety, which she feels has helped her mood. She has  previously used Xanax  and buspirone for anxiety, with Xanax  being more effective for her headaches.  She has a history of elevated blood pressure and is currently experiencing elevated readings, which she attributes to anxiety.  She smokes cigarettes, consuming about a carton every 12-15 days, and uses marijuana for anxiety relief.  Her diet is not on a regular routine; she eats when hungry, sometimes not until dinner. She walks her dog daily for about 30 minutes and reports getting significant physical activity through her work, often reaching 12,000 steps a day.         Past Medical History:  Diagnosis Date   Chronic tension headache    migrane   Depression    Past Surgical History:  Procedure Laterality Date   CHOLECYSTECTOMY     TUBAL LIGATION     Social History   Socioeconomic History   Marital status: Married    Spouse name: Not on file   Number of children: Not on file   Years of education: Not on file   Highest education level: Associate degree: academic program  Occupational History   Not on file  Tobacco Use   Smoking status: Every Day    Current packs/day: 0.50    Average packs/day: 0.5 packs/day for 30.7 years (15.3 ttl pk-yrs)    Types: Cigarettes    Start date: 09/14/1992   Smokeless tobacco: Never  Vaping Use   Vaping status: Never Used  Substance and Sexual Activity   Alcohol use:  No   Drug use: Yes    Types: Marijuana   Sexual activity: Yes  Other Topics Concern   Not on file  Social History Narrative   Not on file   Social Drivers of Health   Financial Resource Strain: High Risk (11/24/2022)   Overall Financial Resource Strain (CARDIA)    Difficulty of Paying Living Expenses: Very hard  Food Insecurity: Food Insecurity Present (11/24/2022)   Hunger Vital Sign    Worried About Running Out of Food in the Last Year: Often true    Ran Out of Food in the Last Year: Often true  Transportation Needs: No Transportation Needs (11/24/2022)   PRAPARE -  Administrator, Civil Service (Medical): No    Lack of Transportation (Non-Medical): No  Physical Activity: Unknown (11/24/2022)   Exercise Vital Sign    Days of Exercise per Week: 0 days    Minutes of Exercise per Session: Not on file  Stress: Stress Concern Present (11/24/2022)   [employer] of Occupational Health - Occupational Stress Questionnaire    Feeling of Stress : To some extent  Social Connections: Socially Isolated (11/24/2022)   Social Connection and Isolation Panel [NHANES]    Frequency of Communication with Friends and Family: Never    Frequency of Social Gatherings with Friends and Family: Never    Attends Religious Services: Never    Diplomatic Services Operational Officer: No    Attends Engineer, Structural: Not on file    Marital Status: Married  Catering Manager Violence: Not on file   Family Status  Relation Name Status   Mother  Alive   Father  Deceased  No partnership data on file   Family History  Problem Relation Age of Onset   Hypertension Mother    Diabetes Mother    Hyperlipidemia Mother    Heart failure Father    Allergies  Allergen Reactions   Aspirin Nausea And Vomiting     Medications: Outpatient Medications Prior to Visit  Medication Sig   citalopram  (CELEXA ) 40 MG tablet Take 1 tablet (40 mg total) by mouth daily.   ibuprofen  (ADVIL ,MOTRIN ) 200 MG tablet Take 200 mg by mouth every 6 (six) hours as needed.   No facility-administered medications prior to visit.    Review of Systems  Last CBC Lab Results  Component Value Date   WBC 6.4 11/28/2022   HGB 14.4 11/28/2022   HCT 41.2 11/28/2022   MCV 89 11/28/2022   MCH 31.2 11/28/2022   RDW 11.8 11/28/2022   PLT 198 11/28/2022   Last metabolic panel Lab Results  Component Value Date   GLUCOSE 111 (H) 11/28/2022   NA 139 11/28/2022   K 4.0 11/28/2022   CL 101 11/28/2022   CO2 24 11/28/2022   BUN 9 11/28/2022   CREATININE 0.70 11/28/2022   EGFR 105  11/28/2022   CALCIUM 9.6 11/28/2022   PROT 7.2 11/28/2022   ALBUMIN 4.5 11/28/2022   LABGLOB 2.7 11/28/2022   BILITOT 0.4 11/28/2022   ALKPHOS 71 11/28/2022   AST 12 11/28/2022   ALT 7 11/28/2022   Last lipids No results found for: CHOL, HDL, LDLCALC, LDLDIRECT, TRIG, CHOLHDL Last hemoglobin A1c Lab Results  Component Value Date   HGBA1C 5.9 (H) 11/28/2022   Last thyroid  functions Lab Results  Component Value Date   TSH 0.932 11/28/2022   Last vitamin D No results found for: 25OHVITD2, 25OHVITD3, VD25OH Last vitamin B12 and Folate No results found  for: VITAMINB12, FOLATE     Objective    BP (!) 155/90   Pulse 79   Resp 20   Ht 5' 1 (1.549 m)   Wt 121 lb 8 oz (55.1 kg)   LMP 05/01/2017   SpO2 98%   BMI 22.96 kg/m  BP Readings from Last 3 Encounters:  05/24/23 (!) 155/90  02/12/23 104/84  12/26/22 (!) 149/86   Wt Readings from Last 3 Encounters:  05/24/23 121 lb 8 oz (55.1 kg)  02/12/23 123 lb (55.8 kg)  12/26/22 124 lb 4.8 oz (56.4 kg)        Physical Exam Vitals reviewed.  Constitutional:      General: She is not in acute distress.    Appearance: Normal appearance. She is not ill-appearing, toxic-appearing or diaphoretic.  HENT:     Head: Normocephalic and atraumatic.     Right Ear: Tympanic membrane and external ear normal. There is no impacted cerumen.     Left Ear: Tympanic membrane and external ear normal. There is no impacted cerumen.     Nose: Nose normal.     Mouth/Throat:     Pharynx: Oropharynx is clear.  Eyes:     General: No scleral icterus.    Extraocular Movements: Extraocular movements intact.     Conjunctiva/sclera: Conjunctivae normal.     Pupils: Pupils are equal, round, and reactive to light.  Cardiovascular:     Rate and Rhythm: Normal rate and regular rhythm.     Pulses: Normal pulses.     Heart sounds: Normal heart sounds. No murmur heard.    No friction rub. No gallop.  Pulmonary:     Effort:  Pulmonary effort is normal. No respiratory distress.     Breath sounds: Normal breath sounds. No wheezing, rhonchi or rales.  Abdominal:     General: Bowel sounds are normal. There is no distension.     Palpations: Abdomen is soft. There is no mass.     Tenderness: There is no abdominal tenderness. There is no guarding.  Musculoskeletal:        General: No deformity.     Cervical back: Normal range of motion and neck supple. No rigidity.     Right lower leg: No edema.     Left lower leg: No edema.  Lymphadenopathy:     Cervical: No cervical adenopathy.  Skin:    General: Skin is warm.     Capillary Refill: Capillary refill takes less than 2 seconds.     Findings: No erythema or rash.  Neurological:     General: No focal deficit present.     Mental Status: She is alert and oriented to person, place, and time.     Motor: No weakness.     Gait: Gait normal.  Psychiatric:        Attention and Perception: Attention normal.        Mood and Affect: Mood is anxious. Mood is not depressed. Affect is not tearful.        Behavior: Behavior normal. Behavior is cooperative.     Comments: Fidgeting        Last depression screening scores    05/24/2023    8:10 AM 12/26/2022    9:06 AM 11/28/2022    1:11 PM  PHQ 2/9 Scores  PHQ - 2 Score 0 1 2  PHQ- 9 Score 0 5 7    Last fall risk screening    05/24/2023    8:11 AM  Fall  Risk   Falls in the past year? 0  Number falls in past yr: 0  Injury with Fall? 0    Last Audit-C alcohol use screening    11/28/2022    1:12 PM  Alcohol Use Disorder Test (AUDIT)  1. How often do you have a drink containing alcohol? 1  2. How many drinks containing alcohol do you have on a typical day when you are drinking? 0  3. How often do you have six or more drinks on one occasion? 0  AUDIT-C Score 1   A score of 3 or more in women, and 4 or more in men indicates increased risk for alcohol abuse, EXCEPT if all of the points are from question 1   No  results found for any visits on 05/24/23.  Assessment & Plan    Routine Health Maintenance and Physical Exam  Immunization History  Administered Date(s) Administered   Influenza, Seasonal, Injecte, Preservative Fre 02/12/2023    Health Maintenance  Topic Date Due   Pneumococcal Vaccine 14-71 Years old (1 of 2 - PCV) Never done   HIV Screening  Never done   Hepatitis C Screening  Never done   DTaP/Tdap/Td (1 - Tdap) Never done   Cervical Cancer Screening (HPV/Pap Cotest)  Never done   Colonoscopy  Never done   MAMMOGRAM  Never done   Zoster Vaccines- Shingrix (1 of 2) Never done   COVID-19 Vaccine (1 - 2024-25 season) Never done   INFLUENZA VACCINE  Completed   HPV VACCINES  Aged Out    Problem List Items Addressed This Visit       Other   Vasomotor symptoms due to menopause   Relevant Orders   CBC   Tobacco use   Relevant Orders   CBC   CMP14+EGFR   Elevated blood pressure reading   Elevated blood pressure noted during the visit. Anxiety may be contributing to elevated readings. Discussed monitoring blood pressure and potential need for medication adjustment if readings remain high. - Monitor blood pressure - Reassess blood pressure at follow-up visit      Relevant Orders   CMP14+EGFR   Chronic tension-type headache, not intractable   Reports chronic headaches for the past three weeks, primarily stress-induced. Headaches start in the temples and progress to the occipital region by the end of the day. Currently taking 2400 mg of ibuprofen  daily with limited relief. Concern for medication overuse headache. Differential includes tension headaches. Discussed risks of high ibuprofen  use, including potential nephrotoxicity. Discussed alternative treatments including muscle relaxants and alprazolam  for interrupting headache cycle. - Prescribe tizanidine  4 mg every six hours as needed - Prescribe alprazolam  0.25 mg twice a day as needed - Order blood tests to check kidney  function and thyroid  levels - Follow-up in one month to reassess headaches and anxiety      Relevant Medications   tiZANidine  (ZANAFLEX ) 4 MG tablet   Anxiety   Significant anxiety contributing to headaches. Currently on citalopram  40 mg, which has improved mood but not sufficiently addressed anxiety. Previous use of buspirone was ineffective. Alprazolam  was effective in the past for headache-related anxiety. Discussed risks and benefits of alprazolam , including potential dependency and its use as a short-term solution. - Prescribe alprazolam  0.25 mg twice a day as needed - Follow-up in one month to reassess anxiety      Relevant Medications   ALPRAZolam  (XANAX ) 0.25 MG tablet   Other Relevant Orders   TSH+T4F+T3Free   Annual physical exam -  Primary   Routine annual physical examination. Discussed the importance of a well-balanced diet, regular exercise, and adequate hydration. - Recommend 150 minutes of exercise weekly - Encourage a well-balanced diet - Advise adequate hydration - TSH, CMP,CBC,lipid panel, a1c collected  -ordered mammogram and colonoscopy referrals today  -recommend Shingrix, pneumococcal, tetanus vaccine  - she will need pap smear       Other Visit Diagnoses       Screening for diabetes mellitus       Relevant Orders   Hemoglobin A1c     Screening for HIV (human immunodeficiency virus)       Relevant Orders   HIV Antibody (routine testing w rflx)     Encounter for hepatitis C screening test for low risk patient       Relevant Orders   Hepatitis C antibody     Screening for lipid disorders       Relevant Orders   Lipid panel     Encounter for screening mammogram for malignant neoplasm of breast       Relevant Orders   MM 3D SCREENING MAMMOGRAM BILATERAL BREAST   Ambulatory referral to Gastroenterology            General Health Maintenance Routine health maintenance screenings and lifestyle recommendations discussed. - Order CBC, lipid panel,  A1c, HIV, and hepatitis C screening - Advise smoking cessation - Encourage regular exercise and balanced diet      Return in about 1 month (around 06/21/2023) for Anxiety.       Rockie Agent, MD  [facility] [phone] (phone) [phone] (fax)  [facility]

## 2023-05-24 ENCOUNTER — Ambulatory Visit: Payer: Medicaid Other | Admitting: Family Medicine

## 2023-05-24 ENCOUNTER — Encounter: Payer: Self-pay | Admitting: Family Medicine

## 2023-05-24 VITALS — BP 155/90 | HR 79 | Resp 20 | Ht 61.0 in | Wt 121.5 lb

## 2023-05-24 DIAGNOSIS — Z1159 Encounter for screening for other viral diseases: Secondary | ICD-10-CM

## 2023-05-24 DIAGNOSIS — G44209 Tension-type headache, unspecified, not intractable: Secondary | ICD-10-CM | POA: Diagnosis not present

## 2023-05-24 DIAGNOSIS — N951 Menopausal and female climacteric states: Secondary | ICD-10-CM | POA: Diagnosis not present

## 2023-05-24 DIAGNOSIS — Z0000 Encounter for general adult medical examination without abnormal findings: Secondary | ICD-10-CM | POA: Insufficient documentation

## 2023-05-24 DIAGNOSIS — G44229 Chronic tension-type headache, not intractable: Secondary | ICD-10-CM | POA: Diagnosis not present

## 2023-05-24 DIAGNOSIS — Z720 Tobacco use: Secondary | ICD-10-CM | POA: Diagnosis not present

## 2023-05-24 DIAGNOSIS — R030 Elevated blood-pressure reading, without diagnosis of hypertension: Secondary | ICD-10-CM | POA: Diagnosis not present

## 2023-05-24 DIAGNOSIS — Z0001 Encounter for general adult medical examination with abnormal findings: Secondary | ICD-10-CM

## 2023-05-24 DIAGNOSIS — Z131 Encounter for screening for diabetes mellitus: Secondary | ICD-10-CM | POA: Diagnosis not present

## 2023-05-24 DIAGNOSIS — Z13220 Encounter for screening for lipoid disorders: Secondary | ICD-10-CM

## 2023-05-24 DIAGNOSIS — Z114 Encounter for screening for human immunodeficiency virus [HIV]: Secondary | ICD-10-CM

## 2023-05-24 DIAGNOSIS — Z1231 Encounter for screening mammogram for malignant neoplasm of breast: Secondary | ICD-10-CM

## 2023-05-24 DIAGNOSIS — F419 Anxiety disorder, unspecified: Secondary | ICD-10-CM | POA: Diagnosis not present

## 2023-05-24 MED ORDER — ALPRAZOLAM 0.25 MG PO TABS: 0.2500 mg | ORAL_TABLET | Freq: Two times a day (BID) | ORAL | 0 refills | Status: DC | PRN

## 2023-05-24 MED ORDER — TIZANIDINE HCL 4 MG PO TABS
4.0000 mg | ORAL_TABLET | Freq: Four times a day (QID) | ORAL | 0 refills | Status: DC | PRN
Start: 2023-05-24 — End: 2023-07-22

## 2023-05-24 NOTE — Assessment & Plan Note (Signed)
 Routine annual physical examination. Discussed the importance of a well-balanced diet, regular exercise, and adequate hydration. - Recommend 150 minutes of exercise weekly - Encourage a well-balanced diet - Advise adequate hydration - TSH, CMP,CBC,lipid panel, a1c collected  -ordered mammogram and colonoscopy referrals today  -recommend Shingrix, pneumococcal, tetanus vaccine  - she will need pap smear

## 2023-05-24 NOTE — Assessment & Plan Note (Signed)
 Elevated blood pressure noted during the visit. Anxiety may be contributing to elevated readings. Discussed monitoring blood pressure and potential need for medication adjustment if readings remain high. - Monitor blood pressure - Reassess blood pressure at follow-up visit

## 2023-05-24 NOTE — Assessment & Plan Note (Signed)
 Significant anxiety contributing to headaches. Currently on citalopram  40 mg, which has improved mood but not sufficiently addressed anxiety. Previous use of buspirone was ineffective. Alprazolam  was effective in the past for headache-related anxiety. Discussed risks and benefits of alprazolam , including potential dependency and its use as a short-term solution. - Prescribe alprazolam  0.25 mg twice a day as needed - Follow-up in one month to reassess anxiety

## 2023-05-24 NOTE — Assessment & Plan Note (Signed)
 Reports chronic headaches for the past three weeks, primarily stress-induced. Headaches start in the temples and progress to the occipital region by the end of the day. Currently taking 2400 mg of ibuprofen  daily with limited relief. Concern for medication overuse headache. Differential includes tension headaches. Discussed risks of high ibuprofen  use, including potential nephrotoxicity. Discussed alternative treatments including muscle relaxants and alprazolam  for interrupting headache cycle. - Prescribe tizanidine  4 mg every six hours as needed - Prescribe alprazolam  0.25 mg twice a day as needed - Order blood tests to check kidney function and thyroid  levels - Follow-up in one month to reassess headaches and anxiety

## 2023-05-24 NOTE — Patient Instructions (Signed)
 Recommend a Pneumonia vaccine  Updated tetanus vaccine  Pap smear for cervical cancer screening

## 2023-05-25 LAB — CMP14+EGFR
ALT: 6 [IU]/L (ref 0–32)
AST: 12 [IU]/L (ref 0–40)
Albumin: 4.4 g/dL (ref 3.8–4.9)
Alkaline Phosphatase: 76 [IU]/L (ref 44–121)
BUN/Creatinine Ratio: 11 (ref 9–23)
BUN: 8 mg/dL (ref 6–24)
Bilirubin Total: 0.2 mg/dL (ref 0.0–1.2)
CO2: 21 mmol/L (ref 20–29)
Calcium: 9.2 mg/dL (ref 8.7–10.2)
Chloride: 101 mmol/L (ref 96–106)
Creatinine, Ser: 0.7 mg/dL (ref 0.57–1.00)
Globulin, Total: 2.5 g/dL (ref 1.5–4.5)
Glucose: 109 mg/dL — ABNORMAL HIGH (ref 70–99)
Potassium: 4 mmol/L (ref 3.5–5.2)
Sodium: 139 mmol/L (ref 134–144)
Total Protein: 6.9 g/dL (ref 6.0–8.5)
eGFR: 104 mL/min/{1.73_m2} (ref >59)

## 2023-05-25 LAB — HEMOGLOBIN A1C
Est. average glucose Bld gHb Est-mCnc: 126 mg/dL
Hgb A1c MFr Bld: 6 % — ABNORMAL HIGH (ref 4.8–5.6)

## 2023-05-25 LAB — LIPID PANEL
Chol/HDL Ratio: 4.6 {ratio} — ABNORMAL HIGH (ref 0.0–4.4)
Cholesterol, Total: 180 mg/dL (ref 100–199)
HDL: 39 mg/dL — ABNORMAL LOW (ref >39)
LDL Chol Calc (NIH): 124 mg/dL — ABNORMAL HIGH (ref 0–99)
Triglycerides: 92 mg/dL (ref 0–149)
VLDL Cholesterol Cal: 17 mg/dL (ref 5–40)

## 2023-05-25 LAB — TSH+T4F+T3FREE
Free T4: 1.18 ng/dL (ref 0.82–1.77)
T3, Free: 3.1 pg/mL (ref 2.0–4.4)
TSH: 0.726 u[IU]/mL (ref 0.450–4.500)

## 2023-05-25 LAB — CBC
Hematocrit: 44.1 % (ref 34.0–46.6)
Hemoglobin: 15.2 g/dL (ref 11.1–15.9)
MCH: 31.9 pg (ref 26.6–33.0)
MCHC: 34.5 g/dL (ref 31.5–35.7)
MCV: 93 fL (ref 79–97)
Platelets: 225 10*3/uL (ref 150–450)
RBC: 4.77 x10E6/uL (ref 3.77–5.28)
RDW: 11.6 % — ABNORMAL LOW (ref 11.7–15.4)
WBC: 4.4 10*3/uL (ref 3.4–10.8)

## 2023-05-25 LAB — HEPATITIS C ANTIBODY: Hep C Virus Ab: NONREACTIVE

## 2023-05-25 LAB — HIV ANTIBODY (ROUTINE TESTING W REFLEX): HIV Screen 4th Generation wRfx: NONREACTIVE

## 2023-05-27 ENCOUNTER — Encounter: Payer: Self-pay | Admitting: Family Medicine

## 2023-06-06 ENCOUNTER — Encounter: Payer: Self-pay | Admitting: *Deleted

## 2023-06-21 ENCOUNTER — Ambulatory Visit: Payer: Medicaid Other | Admitting: Family Medicine

## 2023-06-21 NOTE — Progress Notes (Deleted)
      Established patient visit   Patient: Anita [name]   DOB: 26-Apr-1970   53 y.o. Female  MRN: [id number] Visit Date: 06/21/2023  Today's healthcare provider: Ronnald Ramp, MD   No chief complaint on file.  Subjective       Discussed the use of AI scribe software for clinical note transcription with the patient, who gave verbal consent to proceed.  History of Present Illness             Past Medical History:  Diagnosis Date   Chronic tension headache    migrane   Depression     Medications: Outpatient Medications Prior to Visit  Medication Sig   ALPRAZolam (XANAX) 0.25 MG tablet Take 1 tablet (0.25 mg total) by mouth 2 (two) times daily as needed for anxiety.   citalopram (CELEXA) 40 MG tablet Take 1 tablet (40 mg total) by mouth daily.   ibuprofen (ADVIL,MOTRIN) 200 MG tablet Take 200 mg by mouth every 6 (six) hours as needed.   tiZANidine (ZANAFLEX) 4 MG tablet Take 1 tablet (4 mg total) by mouth every 6 (six) hours as needed for muscle spasms.   No facility-administered medications prior to visit.    Review of Systems  Last CBC Lab Results  Component Value Date   WBC 4.4 05/24/2023   HGB 15.2 05/24/2023   HCT 44.1 05/24/2023   MCV 93 05/24/2023   MCH 31.9 05/24/2023   RDW 11.6 (L) 05/24/2023   PLT 225 05/24/2023   Last metabolic panel Lab Results  Component Value Date   GLUCOSE 109 (H) 05/24/2023   NA 139 05/24/2023   K 4.0 05/24/2023   CL 101 05/24/2023   CO2 21 05/24/2023   BUN 8 05/24/2023   CREATININE 0.70 05/24/2023   EGFR 104 05/24/2023   CALCIUM 9.2 05/24/2023   PROT 6.9 05/24/2023   ALBUMIN 4.4 05/24/2023   LABGLOB 2.5 05/24/2023   BILITOT 0.2 05/24/2023   ALKPHOS 76 05/24/2023   AST 12 05/24/2023   ALT 6 05/24/2023   Last lipids Lab Results  Component Value Date   CHOL 180 05/24/2023   HDL 39 (L) 05/24/2023   LDLCALC 124 (H) 05/24/2023   TRIG 92 05/24/2023   CHOLHDL 4.6 (H) 05/24/2023   Last hemoglobin  A1c Lab Results  Component Value Date   HGBA1C 6.0 (H) 05/24/2023   Last thyroid functions Lab Results  Component Value Date   TSH 0.726 05/24/2023     {See past labs  Heme  Chem  Endocrine  Serology  Results Review (optional):1}   Objective    LMP 05/01/2017  BP Readings from Last 3 Encounters:  05/24/23 (!) 155/90  02/12/23 104/84  12/26/22 (!) 149/86   Wt Readings from Last 3 Encounters:  05/24/23 121 lb 8 oz (55.1 kg)  02/12/23 123 lb (55.8 kg)  12/26/22 124 lb 4.8 oz (56.4 kg)    {See vitals history (optional):1}    Physical Exam  ***  No results found for any visits on 06/21/23.  Assessment & Plan     Problem List Items Addressed This Visit   None                 No follow-ups on file.         Ronnald Ramp, MD  [facility] [phone] (phone) [phone] (fax)  [facility]

## 2023-07-22 ENCOUNTER — Ambulatory Visit: Admitting: Family Medicine

## 2023-07-22 ENCOUNTER — Encounter: Payer: Self-pay | Admitting: Family Medicine

## 2023-07-22 VITALS — BP 122/71 | HR 65 | Ht 61.0 in | Wt 121.0 lb

## 2023-07-22 DIAGNOSIS — Z720 Tobacco use: Secondary | ICD-10-CM

## 2023-07-22 DIAGNOSIS — F419 Anxiety disorder, unspecified: Secondary | ICD-10-CM | POA: Diagnosis not present

## 2023-07-22 DIAGNOSIS — N951 Menopausal and female climacteric states: Secondary | ICD-10-CM | POA: Diagnosis not present

## 2023-07-22 DIAGNOSIS — G44229 Chronic tension-type headache, not intractable: Secondary | ICD-10-CM

## 2023-07-22 MED ORDER — ALPRAZOLAM 0.25 MG PO TABS: 0.2500 mg | ORAL_TABLET | Freq: Two times a day (BID) | ORAL | 0 refills | Status: AC | PRN

## 2023-07-22 MED ORDER — CITALOPRAM HYDROBROMIDE 40 MG PO TABS: 40.0000 mg | ORAL_TABLET | Freq: Every day | ORAL | 3 refills | Status: AC

## 2023-07-22 MED ORDER — TIZANIDINE HCL 4 MG PO TABS: 4.0000 mg | ORAL_TABLET | Freq: Four times a day (QID) | ORAL | 0 refills | Status: AC | PRN

## 2023-07-22 NOTE — Progress Notes (Signed)
 Established patient visit   Patient: Anita [name]   DOB: 1971-02-17   53 y.o. Female  MRN: [id number] Visit Date: 07/22/2023  Today's healthcare provider: Ronnald Ramp, MD   Chief Complaint  Patient presents with   Medication Refill    Med refill no concerns or questions    Subjective     HPI     Medication Refill    Additional comments: Med refill no concerns or questions       Last edited by Thedora Hinders, CMA on 07/22/2023  1:20 PM.       Discussed the use of AI scribe software for clinical note transcription with the patient, who gave verbal consent to proceed.  History of Present Illness Anita [name] is a 53 year old female with chronic anxiety who presents for follow-up and medication refills.  She is currently taking Celexa 40 mg daily and Xanax 0.25 mg twice daily as needed. Celexa is effective, and she can manage if she misses a dose occasionally. Xanax is used primarily for stress-related headaches, and initially required more frequent dosing, but now she can go two to three days without needing it. She takes Xanax when anticipating stress, such as during a long workday, and sometimes requires a muscle relaxer if the headache becomes severe. She has three Xanax tablets remaining from a prescription filled two months ago.  She smokes about a pack of cigarettes a day and has attempted to quit using patches, gum, and Wellbutrin in the past. She wants to reduce her smoking to half a pack a day by the next visit in four months. She has not tried Chantix due to concerns about its use.  Her anxiety was slightly elevated today, possibly due to driving in the rain and using a company [name] she is not accustomed to.     Past Medical History:  Diagnosis Date   Chronic tension headache    migrane   Depression     Medications: Outpatient Medications Prior to Visit  Medication Sig   ibuprofen (ADVIL,MOTRIN) 200 MG tablet Take 200 mg by mouth  every 6 (six) hours as needed.   [DISCONTINUED] ALPRAZolam (XANAX) 0.25 MG tablet Take 1 tablet (0.25 mg total) by mouth 2 (two) times daily as needed for anxiety.   [DISCONTINUED] citalopram (CELEXA) 40 MG tablet Take 1 tablet (40 mg total) by mouth daily.   [DISCONTINUED] tiZANidine (ZANAFLEX) 4 MG tablet Take 1 tablet (4 mg total) by mouth every 6 (six) hours as needed for muscle spasms.   No facility-administered medications prior to visit.    Review of Systems  Last CBC Lab Results  Component Value Date   WBC 4.4 05/24/2023   HGB 15.2 05/24/2023   HCT 44.1 05/24/2023   MCV 93 05/24/2023   MCH 31.9 05/24/2023   RDW 11.6 (L) 05/24/2023   PLT 225 05/24/2023   Last metabolic panel Lab Results  Component Value Date   GLUCOSE 109 (H) 05/24/2023   NA 139 05/24/2023   K 4.0 05/24/2023   CL 101 05/24/2023   CO2 21 05/24/2023   BUN 8 05/24/2023   CREATININE 0.70 05/24/2023   EGFR 104 05/24/2023   CALCIUM 9.2 05/24/2023   PROT 6.9 05/24/2023   ALBUMIN 4.4 05/24/2023   LABGLOB 2.5 05/24/2023   BILITOT 0.2 05/24/2023   ALKPHOS 76 05/24/2023   AST 12 05/24/2023   ALT 6 05/24/2023   Last lipids Lab Results  Component Value Date  CHOL 180 05/24/2023   HDL 39 (L) 05/24/2023   LDLCALC 124 (H) 05/24/2023   TRIG 92 05/24/2023   CHOLHDL 4.6 (H) 05/24/2023   Last hemoglobin A1c Lab Results  Component Value Date   HGBA1C 6.0 (H) 05/24/2023   Last thyroid functions Lab Results  Component Value Date   TSH 0.726 05/24/2023        Objective    BP 122/71   Pulse 65   Ht 5\' 1"  (1.549 m)   Wt 121 lb (54.9 kg)   LMP 05/01/2017   SpO2 99%   BMI 22.86 kg/m  BP Readings from Last 3 Encounters:  07/22/23 122/71  05/24/23 (!) 155/90  02/12/23 104/84   Wt Readings from Last 3 Encounters:  07/22/23 121 lb (54.9 kg)  05/24/23 121 lb 8 oz (55.1 kg)  02/12/23 123 lb (55.8 kg)        Physical Exam  General: Alert, no acute distress Cardio: Normal S1 and S2, RRR, no  r/m/g Pulm: CTAB, normal work of breathing   No results found for any visits on 07/22/23.  Assessment & Plan     Problem List Items Addressed This Visit       Other   Vasomotor symptoms due to menopause   Relevant Medications   citalopram (CELEXA) 40 MG tablet   Tobacco use   Chronic tension-type headache, not intractable   Relevant Medications   citalopram (CELEXA) 40 MG tablet   tiZANidine (ZANAFLEX) 4 MG tablet   Anxiety - Primary   Relevant Medications   ALPRAZolam (XANAX) 0.25 MG tablet   citalopram (CELEXA) 40 MG tablet    Assessment & Plan Tobacco Use Disorder She smokes approximately one pack of cigarettes per day and has attempted to quit using patches, gum, and Wellbutrin. She has not tried Chantix due to concerns. She expresses a desire to reduce smoking to half a pack per day within four months. - Set goal to reduce smoking to half a pack per day within four months  Goals Addressed             This Visit's Progress    Stop or Cut Down Tobacco Use       Timeframe:  Short-Term Goal Priority:  High Start Date:       07/22/23                       Expected End Date:                       Follow Up Date 11/21/23   - cut down amount of tobacco product used, currently smokes 20 cigarettes per day, will aim to cut down to 10 per day in 4 months    Why is this important?   To stop or cut down it is important to have support from a person or group of people who you can count on.  You will also need to think about the things that make you feel like smoking, then plan for how to handle them.    Notes:          Chronic Anxiety Chronic anxiety managed with citalopram 40 mg daily and alprazolam 0.25 mg twice daily as needed. She reports citalopram is effective and she can manage without alprazolam for several days. Alprazolam is used primarily for stress-induced headaches, and she has reduced the frequency of use. She is satisfied with the current regimen. -  Refill citalopram  40 mg daily - Refill alprazolam 0.25 mg twice daily as needed - Refill muscle relaxer as a precaution  General Health Maintenance She is due for a mammogram, Pap smear, tetanus vaccine, and pneumonia vaccine. Insurance does not cover the pneumonia vaccine, and it is recommended to obtain it at the health department. - Order mammogram - Schedule Pap smear in four months - Recommend obtaining pneumonia and tetanus vaccines at the health department due to insurance coverage     Return in about 4 months (around 11/21/2023) for Anxiety, pap .         Ronnald Ramp, MD  [facility] [phone] (phone) [phone] (fax)  [facility]

## 2023-07-22 NOTE — Patient Instructions (Signed)
 [facility] at [facility] [address] [place],  [place]  [address] Main: [phone]

## 2023-12-02 ENCOUNTER — Encounter: Payer: Self-pay | Admitting: Family Medicine

## 2023-12-02 NOTE — Patient Instructions (Incomplete)
 It was a pleasure to see you today!  Thank you for choosing [facility] for your primary care.   Today you were seen for your annual physical  Please review the attached information regarding helpful preventive health topics.   To keep you healthy, please keep in mind the following health maintenance items that you are due for:   Health Maintenance Due  Topic Date Due   DTaP/Tdap/Td (1 - Tdap) Never done   Pneumococcal Vaccine: 50+ Years (1 of 2 - PCV) Never done   Hepatitis B Vaccines 19-59 Average Risk (1 of 3 - 19+ 3-dose series) Never done   Cervical Cancer Screening (HPV/Pap Cotest)  Never done   Colonoscopy  Never done   MAMMOGRAM  Never done   Zoster Vaccines- Shingrix (1 of 2) Never done   COVID-19 Vaccine (1 - 2024-25 season) Never done   INFLUENZA VACCINE  11/15/2023     Best Wishes,   Dr. Lang

## 2023-12-02 NOTE — Progress Notes (Deleted)
 Complete physical exam   Patient: Anita [name]   DOB: 1971/01/25   53 y.o. Female  MRN: [id number] Visit Date: 12/02/2023  Today's healthcare provider: Rockie Agent, MD   No chief complaint on file.  Subjective    Anita [name] is a 53 y.o. female who presents today for a complete physical exam.    She {does/does not:200015} have additional problems to discuss today.   Discussed the use of AI scribe software for clinical note transcription with the patient, who gave verbal consent to proceed.  History of Present Illness      Past Medical History:  Diagnosis Date   Chronic tension headache    migrane   Depression    Past Surgical History:  Procedure Laterality Date   CHOLECYSTECTOMY     TUBAL LIGATION     Social History   Socioeconomic History   Marital status: Married    Spouse name: Not on file   Number of children: Not on file   Years of education: Not on file   Highest education level: Associate degree: academic program  Occupational History   Not on file  Tobacco Use   Smoking status: Every Day    Current packs/day: 0.50    Average packs/day: 0.5 packs/day for 31.2 years (15.6 ttl pk-yrs)    Types: Cigarettes    Start date: 09/14/1992   Smokeless tobacco: Never  Vaping Use   Vaping status: Never Used  Substance and Sexual Activity   Alcohol use: No   Drug use: Yes    Types: Marijuana   Sexual activity: Yes  Other Topics Concern   Not on file  Social History Narrative   Not on file   Social Drivers of Health   Financial Resource Strain: High Risk (11/24/2022)   Overall Financial Resource Strain (CARDIA)    Difficulty of Paying Living Expenses: Very hard  Food Insecurity: Food Insecurity Present (11/24/2022)   Hunger Vital Sign    Worried About Running Out of Food in the Last Year: Often true    Ran Out of Food in the Last Year: Often true  Transportation Needs: No Transportation Needs (11/24/2022)   PRAPARE -  Administrator, Civil Service (Medical): No    Lack of Transportation (Non-Medical): No  Physical Activity: Unknown (11/24/2022)   Exercise Vital Sign    Days of Exercise per Week: 0 days    Minutes of Exercise per Session: Not on file  Stress: Stress Concern Present (11/24/2022)   [employer] of Occupational Health - Occupational Stress Questionnaire    Feeling of Stress : To some extent  Social Connections: Socially Isolated (11/24/2022)   Social Connection and Isolation Panel    Frequency of Communication with Friends and Family: Never    Frequency of Social Gatherings with Friends and Family: Never    Attends Religious Services: Never    Database administrator or Organizations: No    Attends Engineer, structural: Not on file    Marital Status: Married  Catering manager Violence: Not At Risk (07/22/2023)   Humiliation, Afraid, Rape, and Kick questionnaire    Fear of Current or Ex-Partner: No    Emotionally Abused: No    Physically Abused: No    Sexually Abused: No   Family Status  Relation Name Status   Mother  Deceased   Father  Deceased  No partnership data on file   Family History  Problem Relation Age of Onset  Hypertension Mother    Diabetes Mother    Hyperlipidemia Mother    Heart failure Father    Allergies  Allergen Reactions   Aspirin Nausea And Vomiting     Medications: Outpatient Medications Prior to Visit  Medication Sig   ALPRAZolam  (XANAX ) 0.25 MG tablet Take 1 tablet (0.25 mg total) by mouth 2 (two) times daily as needed for anxiety.   citalopram  (CELEXA ) 40 MG tablet Take 1 tablet (40 mg total) by mouth daily.   ibuprofen  (ADVIL ,MOTRIN ) 200 MG tablet Take 200 mg by mouth every 6 (six) hours as needed.   tiZANidine  (ZANAFLEX ) 4 MG tablet Take 1 tablet (4 mg total) by mouth every 6 (six) hours as needed for muscle spasms.   No facility-administered medications prior to visit.    Review of Systems  Last CBC Lab Results   Component Value Date   WBC 4.4 05/24/2023   HGB 15.2 05/24/2023   HCT 44.1 05/24/2023   MCV 93 05/24/2023   MCH 31.9 05/24/2023   RDW 11.6 (L) 05/24/2023   PLT 225 05/24/2023   Last metabolic panel Lab Results  Component Value Date   GLUCOSE 109 (H) 05/24/2023   NA 139 05/24/2023   K 4.0 05/24/2023   CL 101 05/24/2023   CO2 21 05/24/2023   BUN 8 05/24/2023   CREATININE 0.70 05/24/2023   EGFR 104 05/24/2023   CALCIUM 9.2 05/24/2023   PROT 6.9 05/24/2023   ALBUMIN 4.4 05/24/2023   LABGLOB 2.5 05/24/2023   BILITOT 0.2 05/24/2023   ALKPHOS 76 05/24/2023   AST 12 05/24/2023   ALT 6 05/24/2023   Last lipids Lab Results  Component Value Date   CHOL 180 05/24/2023   HDL 39 (L) 05/24/2023   LDLCALC 124 (H) 05/24/2023   TRIG 92 05/24/2023   CHOLHDL 4.6 (H) 05/24/2023   The 10-year ASCVD risk score (Arnett DK, et al., 2019) is: 4.7%  Last hemoglobin A1c Lab Results  Component Value Date   HGBA1C 6.0 (H) 05/24/2023   Last thyroid  functions Lab Results  Component Value Date   TSH 0.726 05/24/2023   Last vitamin D No results found for: 25OHVITD2, 25OHVITD3, VD25OH Last vitamin B12 and Folate No results found for: VITAMINB12, FOLATE   {See past labs  Heme  Chem  Endocrine  Serology  Results Review (optional):1}  Objective    LMP 05/01/2017  BP Readings from Last 3 Encounters:  07/22/23 122/71  05/24/23 (!) 155/90  02/12/23 104/84   Wt Readings from Last 3 Encounters:  07/22/23 121 lb (54.9 kg)  05/24/23 121 lb 8 oz (55.1 kg)  02/12/23 123 lb (55.8 kg)    {See vitals history (optional):1}    Physical Exam  ***  Last depression screening scores    07/22/2023    1:22 PM 05/24/2023    8:10 AM 12/26/2022    9:06 AM  PHQ 2/9 Scores  PHQ - 2 Score 0 0 1  PHQ- 9 Score 0 0 5    Last fall risk screening    05/24/2023    8:11 AM  Fall Risk   Falls in the past year? 0  Number falls in past yr: 0  Injury with Fall? 0    Last Audit-C  alcohol use screening    11/28/2022    1:12 PM  Alcohol Use Disorder Test (AUDIT)  1. How often do you have a drink containing alcohol? 1  2. How many drinks containing alcohol do you have on a typical  day when you are drinking? 0  3. How often do you have six or more drinks on one occasion? 0  AUDIT-C Score 1   A score of 3 or more in women, and 4 or more in men indicates increased risk for alcohol abuse, EXCEPT if all of the points are from question 1   No results found for any visits on 12/02/23.  Assessment & Plan    Routine Health Maintenance and Physical Exam  Immunization History  Administered Date(s) Administered   Influenza, Seasonal, Injecte, Preservative Fre 02/12/2023    Health Maintenance  Topic Date Due   DTaP/Tdap/Td (1 - Tdap) Never done   Pneumococcal Vaccine: 50+ Years (1 of 2 - PCV) Never done   Hepatitis B Vaccines 19-59 Average Risk (1 of 3 - 19+ 3-dose series) Never done   Cervical Cancer Screening (HPV/Pap Cotest)  Never done   Colonoscopy  Never done   MAMMOGRAM  Never done   Zoster Vaccines- Shingrix (1 of 2) Never done   COVID-19 Vaccine (1 - 2024-25 season) Never done   INFLUENZA VACCINE  11/15/2023   Hepatitis C Screening  Completed   HIV Screening  Completed   HPV VACCINES  Aged Out   Meningococcal B Vaccine  Aged Out    Problem List Items Addressed This Visit   None    Assessment & Plan   ***Chronic conditions are stable  Patient was counseled on benefits of regular physical activity with goal of 150 minutes of moderate to vigurous intensity 4 days per week  Patient was counseled to consume well balanced diet of fruits, vegetables, limited saturated fats and limited sugary foods and beverages with emphasis on consuming 6-8 glasses of water daily  Screening recommended today: A1c, lipids,CMP,CBC  Colon cancer screening: previously referred, pt will need to call to schedule appointment     Cervical CA screening: ordered today     No  results found for: DIAGPAP, HPVHIGH, ADEQPAP    Mammogram: previously ordered, patient will need to call to schedule      @MAMMOFINDINGS @   Lung CA screening CT: n/A    Vaccines recommended today: COVID,Shingrix, Tetanus booster, Pneumococcal       No follow-ups on file.       Rockie Agent, MD  [facility] [phone] (phone) [phone] (fax)  [facility]

## 2024-05-07 ENCOUNTER — Encounter: Payer: Medicaid Other | Admitting: Family Medicine
